# Patient Record
Sex: Female | Born: 1975 | Race: White | Hispanic: No | Marital: Married | Smoking: Never smoker
Health system: Southern US, Community
[De-identification: ages and names within clinical notes are randomized; demographics above are authoritative.]

## PROBLEM LIST (undated history)

## (undated) DIAGNOSIS — J45909 Unspecified asthma, uncomplicated: Secondary | ICD-10-CM

## (undated) DIAGNOSIS — Z9889 Other specified postprocedural states: Secondary | ICD-10-CM

## (undated) DIAGNOSIS — I10 Essential (primary) hypertension: Secondary | ICD-10-CM

## (undated) DIAGNOSIS — Z1509 Genetic susceptibility to other malignant neoplasm: Secondary | ICD-10-CM

## (undated) DIAGNOSIS — R112 Nausea with vomiting, unspecified: Secondary | ICD-10-CM

## (undated) DIAGNOSIS — Z800 Family history of malignant neoplasm of digestive organs: Secondary | ICD-10-CM

## (undated) DIAGNOSIS — J302 Other seasonal allergic rhinitis: Secondary | ICD-10-CM

## (undated) DIAGNOSIS — G43909 Migraine, unspecified, not intractable, without status migrainosus: Secondary | ICD-10-CM

## (undated) HISTORY — PX: CHOLECYSTECTOMY: SHX55

## (undated) HISTORY — PX: APPENDECTOMY: SHX54

## (undated) HISTORY — PX: NASAL SINUS SURGERY: SHX719

## (undated) HISTORY — PX: TONSILLECTOMY: SUR1361

## (undated) HISTORY — PX: ABDOMINAL HYSTERECTOMY: SHX81

## (undated) HISTORY — DX: Family history of malignant neoplasm of digestive organs: Z80.0

---

## 2011-04-18 ENCOUNTER — Emergency Department (HOSPITAL_BASED_OUTPATIENT_CLINIC_OR_DEPARTMENT_OTHER)
Admission: EM | Admit: 2011-04-18 | Discharge: 2011-04-19 | Disposition: A | Discharge status: Other Acute Inpatient Hospital | Payer: BC Managed Care – PPO | Attending: Emergency Medicine | Admitting: Emergency Medicine

## 2011-04-18 DIAGNOSIS — R45851 Suicidal ideations: Secondary | ICD-10-CM | POA: Insufficient documentation

## 2011-04-18 DIAGNOSIS — J45909 Unspecified asthma, uncomplicated: Secondary | ICD-10-CM | POA: Insufficient documentation

## 2011-04-18 DIAGNOSIS — N390 Urinary tract infection, site not specified: Secondary | ICD-10-CM | POA: Insufficient documentation

## 2011-04-18 LAB — URINALYSIS, ROUTINE W REFLEX MICROSCOPIC
Bilirubin Urine: NEGATIVE
Ketones, ur: 15 mg/dL — AB
Nitrite: NEGATIVE
Protein, ur: 30 mg/dL — AB
Urobilinogen, UA: 0.2 mg/dL (ref 0.0–1.0)
pH: 6.5 (ref 5.0–8.0)

## 2011-04-18 LAB — DIFFERENTIAL
Basophils Absolute: 0 10*3/uL (ref 0.0–0.1)
Basophils Relative: 0 % (ref 0–1)
Eosinophils Absolute: 0 10*3/uL (ref 0.0–0.7)
Monocytes Absolute: 0.5 10*3/uL (ref 0.1–1.0)
Monocytes Relative: 6 % (ref 3–12)
Neutro Abs: 7.5 10*3/uL (ref 1.7–7.7)
Neutrophils Relative %: 77 % (ref 43–77)

## 2011-04-18 LAB — RAPID URINE DRUG SCREEN, HOSP PERFORMED
Barbiturates: NOT DETECTED
Benzodiazepines: NOT DETECTED
Cocaine: NOT DETECTED
Opiates: NOT DETECTED
Tetrahydrocannabinol: NOT DETECTED

## 2011-04-18 LAB — CBC
Hemoglobin: 13.8 g/dL (ref 12.0–15.0)
MCH: 31.3 pg (ref 26.0–34.0)
MCHC: 35.3 g/dL (ref 30.0–36.0)
Platelets: 315 10*3/uL (ref 150–400)

## 2011-04-18 LAB — BASIC METABOLIC PANEL
CO2: 20 mEq/L (ref 19–32)
Calcium: 9.5 mg/dL (ref 8.4–10.5)
Chloride: 103 mEq/L (ref 96–112)
Glucose, Bld: 110 mg/dL — ABNORMAL HIGH (ref 70–99)
Potassium: 3.3 mEq/L — ABNORMAL LOW (ref 3.5–5.1)
Sodium: 139 mEq/L (ref 135–145)

## 2011-04-18 LAB — PREGNANCY, URINE: Preg Test, Ur: NEGATIVE

## 2011-04-18 LAB — ETHANOL: Alcohol, Ethyl (B): <11 mg/dL (ref 0–11)

## 2011-04-19 ENCOUNTER — Inpatient Hospital Stay (HOSPITAL_COMMUNITY): Admission: AD | Admit: 2011-04-19 | Payer: BC Managed Care – PPO | Source: Ambulatory Visit | Admitting: Psychiatry

## 2012-09-03 ENCOUNTER — Emergency Department (HOSPITAL_BASED_OUTPATIENT_CLINIC_OR_DEPARTMENT_OTHER)
Admission: EM | Admit: 2012-09-03 | Discharge: 2012-09-03 | Disposition: A | Discharge status: Home / Self Care | Payer: 59 | Attending: Emergency Medicine | Admitting: Emergency Medicine

## 2012-09-03 ENCOUNTER — Encounter (HOSPITAL_BASED_OUTPATIENT_CLINIC_OR_DEPARTMENT_OTHER): Payer: Self-pay | Admitting: *Deleted

## 2012-09-03 DIAGNOSIS — J45909 Unspecified asthma, uncomplicated: Secondary | ICD-10-CM | POA: Insufficient documentation

## 2012-09-03 DIAGNOSIS — J309 Allergic rhinitis, unspecified: Secondary | ICD-10-CM | POA: Insufficient documentation

## 2012-09-03 DIAGNOSIS — Z79899 Other long term (current) drug therapy: Secondary | ICD-10-CM | POA: Insufficient documentation

## 2012-09-03 DIAGNOSIS — G43909 Migraine, unspecified, not intractable, without status migrainosus: Secondary | ICD-10-CM | POA: Insufficient documentation

## 2012-09-03 DIAGNOSIS — R110 Nausea: Secondary | ICD-10-CM | POA: Insufficient documentation

## 2012-09-03 HISTORY — DX: Unspecified asthma, uncomplicated: J45.909

## 2012-09-03 HISTORY — DX: Other seasonal allergic rhinitis: J30.2

## 2012-09-03 HISTORY — DX: Migraine, unspecified, not intractable, without status migrainosus: G43.909

## 2012-09-03 MED ORDER — METOCLOPRAMIDE HCL 5 MG/ML IJ SOLN
10.0000 mg | Freq: Once | INTRAMUSCULAR | Status: AC
  Administered 2012-09-03: 10 mg via INTRAVENOUS
  Filled 2012-09-03: qty 2

## 2012-09-03 MED ORDER — DEXAMETHASONE SODIUM PHOSPHATE 10 MG/ML IJ SOLN
10.0000 mg | Freq: Once | INTRAMUSCULAR | Status: AC
  Administered 2012-09-03: 10 mg via INTRAVENOUS
  Filled 2012-09-03: qty 1

## 2012-09-03 MED ORDER — SODIUM CHLORIDE 0.9 % IV BOLUS (SEPSIS)
1000.0000 mL | Freq: Once | INTRAVENOUS | Status: AC
  Administered 2012-09-03: 1000 mL via INTRAVENOUS

## 2012-09-03 MED ORDER — DIPHENHYDRAMINE HCL 50 MG/ML IJ SOLN
25.0000 mg | Freq: Once | INTRAMUSCULAR | Status: AC
  Administered 2012-09-03: 25 mg via INTRAVENOUS
  Filled 2012-09-03: qty 1

## 2012-09-03 NOTE — ED Notes (Signed)
Migraine H/A onset 5p. No relief with Imitrex and OTC meds.

## 2012-09-03 NOTE — ED Provider Notes (Signed)
History   This chart was scribed for Rolan Bucco, MD by Thad Ranger, ED Scribe. This patient was seen in room MH05/MH05 and the patient's care was started at 9:40 PM.   CSN: [id number]  Arrival date & time 09/03/12  2020   None     Chief Complaint  Patient presents with  . Migraine    The history is provided by the patient and the spouse. No language interpreter was used.    Jamie Ellis is a 36 y.o. female with a history of migraines, who presents to the Emergency Department complaining of gradually worsening, gradual onset, constant migraine onset 4 hours ago. Patient reports that iheadache started in both sides and in the back of her head now it subsided to the front side of the face. There is associated nausea and eye pain. Patient states that she is having the same symptoms as her pervious episodes but slightly more intense. Patient is otherwise healthy. She denies fever, chills, vomiting, and abdominal pain. Patient repots taking Imitrex and OTC medications with no relief. She denies smoking and alcohol use.  Denies any fevers/neck stiffness, or recent head trauma.   Past Medical History  Diagnosis Date  . Migraines   . Asthma   . Seasonal allergies     Past Surgical History  Procedure Date  . Cholecystectomy   . Appendectomy   . Nasal sinus surgery     History reviewed. No pertinent family history.  History  Substance Use Topics  . Smoking status: Never Smoker   . Smokeless tobacco: Not on file  . Alcohol Use: No    No OB history provided.  Review of Systems  Constitutional: Negative for fever, chills, diaphoresis and fatigue.  HENT: Negative for congestion, rhinorrhea and sneezing.   Eyes: Negative.   Respiratory: Negative for cough, chest tightness and shortness of breath.   Cardiovascular: Negative for chest pain and leg swelling.  Gastrointestinal: Positive for nausea. Negative for vomiting, abdominal pain, diarrhea and blood in stool.    Genitourinary: Negative for frequency, hematuria, flank pain and difficulty urinating.  Musculoskeletal: Negative for back pain and arthralgias.  Skin: Negative for rash.  Neurological: Positive for headaches. Negative for dizziness, speech difficulty, weakness and numbness.    Allergies  Penicillins  Home Medications   Current Outpatient Rx  Name  Route  Sig  Dispense  Refill  . LORATADINE 10 MG PO TABS   Oral   Take 10 mg by mouth daily.         . MULTIVITAMINS PO CAPS   Oral   Take 1 capsule by mouth daily.         . SUMATRIPTAN SUCCINATE 100 MG PO TABS   Oral   Take 100 mg by mouth every 2 (two) hours as needed.           BP 151/109  Pulse 100  Temp 98.7 F (37.1 C) (Oral)  Resp 16  Ht 5\' 3"  (1.6 m)  Wt 187 lb (84.823 kg)  BMI 33.13 kg/m2  SpO2 98%  LMP 09/03/2012  Physical Exam  Constitutional: She is oriented to person, place, and time. She appears well-developed and well-nourished.  HENT:  Head: Normocephalic and atraumatic.  Eyes: Pupils are equal, round, and reactive to light.  Neck: Normal range of motion. Neck supple. No Brudzinski's sign noted.  Cardiovascular: Normal rate, regular rhythm and normal heart sounds.   Pulmonary/Chest: Effort normal and breath sounds normal. No respiratory distress. She has no wheezes.  She has no rales. She exhibits no tenderness.  Abdominal: Soft. Bowel sounds are normal. There is no tenderness. There is no rebound and no guarding.  Musculoskeletal: Normal range of motion. She exhibits no edema.  Lymphadenopathy:    She has no cervical adenopathy.  Neurological: She is alert and oriented to person, place, and time. She has normal strength. No cranial nerve deficit or sensory deficit. GCS eye subscore is 4. GCS verbal subscore is 5. GCS motor subscore is 6.       FTN intact   Skin: Skin is warm and dry. No rash noted.  Psychiatric: She has a normal mood and affect.    ED Course  Procedures (including critical  care time)  DIAGNOSTIC STUDIES: Oxygen Saturation is 98% on room air, normal by my interpretation.    COORDINATION OF CARE: 9:40 PM Discussed treatment plan with pt at bedside and pt agreed to plan.   Labs Reviewed - No data to display No results found.   1. Migraine       MDM  Pt feels much better after migraine cocktail.  Says that the pain is similar to her past migraines.  Nothing unusual to suggest meningitis, SAH.  Advised to return if symptoms worsen or if she has pain that is atypical as compared to her normal migraine pain      I personally performed the services described in this documentation, which was scribed in my presence.  The recorded information has been reviewed and considered.    Rolan Bucco, MD 09/03/12 2252

## 2014-12-14 ENCOUNTER — Encounter (HOSPITAL_BASED_OUTPATIENT_CLINIC_OR_DEPARTMENT_OTHER): Payer: Self-pay | Admitting: *Deleted

## 2014-12-14 ENCOUNTER — Emergency Department (HOSPITAL_BASED_OUTPATIENT_CLINIC_OR_DEPARTMENT_OTHER)
Admission: EM | Admit: 2014-12-14 | Discharge: 2014-12-14 | Disposition: A | Discharge status: Home / Self Care | Payer: 59 | Attending: Emergency Medicine | Admitting: Emergency Medicine

## 2014-12-14 DIAGNOSIS — J45909 Unspecified asthma, uncomplicated: Secondary | ICD-10-CM | POA: Diagnosis not present

## 2014-12-14 DIAGNOSIS — Z880 Allergy status to penicillin: Secondary | ICD-10-CM | POA: Insufficient documentation

## 2014-12-14 DIAGNOSIS — Z79899 Other long term (current) drug therapy: Secondary | ICD-10-CM | POA: Diagnosis not present

## 2014-12-14 DIAGNOSIS — I10 Essential (primary) hypertension: Secondary | ICD-10-CM | POA: Diagnosis not present

## 2014-12-14 DIAGNOSIS — S0181XA Laceration without foreign body of other part of head, initial encounter: Secondary | ICD-10-CM | POA: Diagnosis present

## 2014-12-14 DIAGNOSIS — Y998 Other external cause status: Secondary | ICD-10-CM | POA: Diagnosis not present

## 2014-12-14 DIAGNOSIS — Y9289 Other specified places as the place of occurrence of the external cause: Secondary | ICD-10-CM | POA: Diagnosis not present

## 2014-12-14 DIAGNOSIS — G43909 Migraine, unspecified, not intractable, without status migrainosus: Secondary | ICD-10-CM | POA: Insufficient documentation

## 2014-12-14 DIAGNOSIS — Y9389 Activity, other specified: Secondary | ICD-10-CM | POA: Diagnosis not present

## 2014-12-14 DIAGNOSIS — W01198A Fall on same level from slipping, tripping and stumbling with subsequent striking against other object, initial encounter: Secondary | ICD-10-CM | POA: Diagnosis not present

## 2014-12-14 HISTORY — DX: Essential (primary) hypertension: I10

## 2014-12-14 MED ORDER — LIDOCAINE-EPINEPHRINE (PF) 2 %-1:200000 IJ SOLN
10.0000 mL | Freq: Once | INTRAMUSCULAR | Status: AC
  Administered 2014-12-14: 10 mL via INTRADERMAL
  Filled 2014-12-14: qty 20

## 2014-12-14 NOTE — ED Provider Notes (Signed)
CSN: [id number]     Arrival date & time 12/14/14  4098 History   First MD Initiated Contact with Patient 12/14/14 2203     Chief Complaint  Patient presents with  . Facial Laceration     (Consider location/radiation/quality/duration/timing/severity/associated sxs/prior Treatment) HPI   Jamie Ellis is a 39 y.o. female complaining of laceration above her right eye after she slipped over a puppy prior to arrival. Patient's face impacted at [employer] [employer], the teakettle did not break. Her pain is minimal, tetanus shot was within the last 5 years. Bleeding is controlled.  Past Medical History  Diagnosis Date  . Migraines   . Asthma   . Seasonal allergies   . Hypertension    Past Surgical History  Procedure Laterality Date  . Cholecystectomy    . Appendectomy    . Nasal sinus surgery     No family history on file. History  Substance Use Topics  . Smoking status: Never Smoker   . Smokeless tobacco: Never Used  . Alcohol Use: No   OB History    No data available     Review of Systems  10 systems reviewed and found to be negative, except as noted in the HPI.   Allergies  Penicillins  Home Medications   Prior to Admission medications   Medication Sig Start Date End Date Taking? Authorizing Provider  citalopram (CELEXA) 20 MG tablet Take 20 mg by mouth daily.   Yes Historical Provider, MD  loratadine (CLARITIN) 10 MG tablet Take 10 mg by mouth daily.   Yes Historical Provider, MD  metoprolol succinate (TOPROL-XL) 100 MG 24 hr tablet Take 100 mg by mouth daily. Take with or immediately following a meal.   Yes Historical Provider, MD  Multiple Vitamin (MULTIVITAMIN) capsule Take 1 capsule by mouth daily.   Yes Historical Provider, MD  SUMAtriptan (IMITREX) 100 MG tablet Take 100 mg by mouth every 2 (two) hours as needed.   Yes Historical Provider, MD   BP 141/97 mmHg  Pulse 110  Temp(Src) 98.5 F (36.9 C) (Oral)  Resp 20  Ht  (1.6 m)  Wt 192 lb (87.091 kg)  BMI  34.02 kg/m2  SpO2 96% Physical Exam  Constitutional: She is oriented to person, place, and time. She appears well-developed and well-nourished. No distress.  HENT:  Head: Normocephalic.    Mouth/Throat: Oropharynx is clear and moist.  2 cm full-thickness laceration, bleeding is controlled. No bone is visible. There is no tenderness palpation along the orbital rim.  Eyes: Conjunctivae and EOM are normal.  Cardiovascular: Normal rate, regular rhythm and intact distal pulses.   Pulmonary/Chest: Effort normal. No stridor.  Musculoskeletal: Normal range of motion.  Neurological: She is alert and oriented to person, place, and time.  Psychiatric: She has a normal mood and affect.  Nursing note and vitals reviewed.   ED Course  LACERATION REPAIR Date/Time: 12/15/2014 12:03 AM Performed by: Wynetta Emery Authorized by: Wynetta Emery Consent: Verbal consent obtained. Risks and benefits: risks, benefits and alternatives were discussed Consent given by: patient Patient identity confirmed: verbally with patient Body area: head/neck Location details: forehead Laceration length: 2 cm Foreign bodies: no foreign bodies Tendon involvement: none Nerve involvement: none Local anesthetic: bupivacaine 0.5% with epinephrine Anesthetic total: 3 ml Patient sedated: no Preparation: Patient was prepped and draped in the usual sterile fashion. Irrigation solution: saline Irrigation method: syringe Amount of cleaning: standard Debridement: none Degree of undermining: none Skin closure: Ethilon (6-0) Wound subcutaneous closure material used:  4-0 Vicryl Rapide. Number of sutures: 5 Technique: running Approximation: close Approximation difficulty: simple Dressing: antibiotic ointment Patient tolerance: Patient tolerated the procedure well with no immediate complications   (including critical care time) Labs Review Labs Reviewed - No data to display  Imaging Review No results  found.   EKG Interpretation None      MDM   Final diagnoses:  Facial laceration, initial encounter    Filed Vitals:   12/14/14 1930 12/14/14 2330  BP: 141/97 101/64  Pulse: 110 79  Temp: 98.5 F (36.9 C)   TempSrc: Oral   Resp: 20   Height: 5\' 3"  (1.6 m)   Weight: 192 lb (87.091 kg)   SpO2: 96% 92%    Medications  lidocaine-EPINEPHrine (XYLOCAINE W/EPI) 2 %-1:200000 (PF) injection 10 mL (10 mLs Intradermal Given by Other 12/14/14 2332)    Jamie Ellis is a pleasant 39 y.o. female presenting with laceration over left eyebrow. Wound is irrigated, one absorbable suture is placed in the deep tissue, wound is closed with good approximation of borders, instructed patient on wound care.  Evaluation does not show pathology that would require ongoing emergent intervention or inpatient treatment. Pt is hemodynamically stable and mentating appropriately. Discussed findings and plan with patient/guardian, who agrees with care plan. All questions answered. Return precautions discussed and outpatient follow up given.      Wynetta Emeryicole Mihailo Sage, PA-C 12/15/14 0006  Richardean Canalavid H Yao, MD 12/17/14 [phone]

## 2014-12-14 NOTE — Discharge Instructions (Signed)
Keep wound dry and do not remove dressing for 24 hours if possible. After that, wash gently morning and night (every 12 hours) with soap and water. Use a topical antibiotic ointment and cover with a bandaid or gauze.  °  °Do NOT use rubbing alcohol or hydrogen peroxide, do not soak the area °  °Present to your primary care doctor or the urgent care of your choice, or the ED for suture removal in 7-10 days. °  °Every attempt was made to remove foreign body (contaminants) from the wound.  However, there is always a chance that some may remain in the wound. This can  increase your risk of infection. °  °If you see signs of infection (warmth, redness, tenderness, pus, sharp increase in pain, fever, red streaking in the skin) immediately return to the emergency department. °  °After the wound heals fully, apply sunscreen for 6-12 months to minimize scarring.  ° °

## 2014-12-14 NOTE — ED Notes (Signed)
Report tripped over her dog and landed on tea kettle- has lac next to right eyebrow- bleeding controlled

## 2014-12-15 ENCOUNTER — Emergency Department (HOSPITAL_BASED_OUTPATIENT_CLINIC_OR_DEPARTMENT_OTHER)
Admission: EM | Admit: 2014-12-15 | Discharge: 2014-12-16 | Disposition: A | Discharge status: Home / Self Care | Payer: 59 | Attending: Emergency Medicine | Admitting: Emergency Medicine

## 2014-12-15 ENCOUNTER — Emergency Department (HOSPITAL_BASED_OUTPATIENT_CLINIC_OR_DEPARTMENT_OTHER): Payer: 59

## 2014-12-15 ENCOUNTER — Encounter (HOSPITAL_BASED_OUTPATIENT_CLINIC_OR_DEPARTMENT_OTHER): Payer: Self-pay | Admitting: Emergency Medicine

## 2014-12-15 DIAGNOSIS — S0990XD Unspecified injury of head, subsequent encounter: Secondary | ICD-10-CM | POA: Diagnosis present

## 2014-12-15 DIAGNOSIS — Z79899 Other long term (current) drug therapy: Secondary | ICD-10-CM | POA: Diagnosis not present

## 2014-12-15 DIAGNOSIS — G43909 Migraine, unspecified, not intractable, without status migrainosus: Secondary | ICD-10-CM | POA: Diagnosis not present

## 2014-12-15 DIAGNOSIS — W1839XD Other fall on same level, subsequent encounter: Secondary | ICD-10-CM | POA: Insufficient documentation

## 2014-12-15 DIAGNOSIS — R110 Nausea: Secondary | ICD-10-CM

## 2014-12-15 DIAGNOSIS — S060X0D Concussion without loss of consciousness, subsequent encounter: Secondary | ICD-10-CM | POA: Diagnosis not present

## 2014-12-15 DIAGNOSIS — I10 Essential (primary) hypertension: Secondary | ICD-10-CM | POA: Diagnosis not present

## 2014-12-15 DIAGNOSIS — R112 Nausea with vomiting, unspecified: Secondary | ICD-10-CM | POA: Insufficient documentation

## 2014-12-15 DIAGNOSIS — J45909 Unspecified asthma, uncomplicated: Secondary | ICD-10-CM | POA: Insufficient documentation

## 2014-12-15 DIAGNOSIS — J029 Acute pharyngitis, unspecified: Secondary | ICD-10-CM | POA: Diagnosis not present

## 2014-12-15 DIAGNOSIS — Z880 Allergy status to penicillin: Secondary | ICD-10-CM | POA: Insufficient documentation

## 2014-12-15 DIAGNOSIS — R5383 Other fatigue: Secondary | ICD-10-CM | POA: Insufficient documentation

## 2014-12-15 LAB — CBC WITH DIFFERENTIAL/PLATELET
BASOS ABS: 0 10*3/uL (ref 0.0–0.1)
BASOS PCT: 0 % (ref 0–1)
EOS ABS: 0.1 10*3/uL (ref 0.0–0.7)
EOS PCT: 1 % (ref 0–5)
HCT: 38.1 % (ref 36.0–46.0)
Hemoglobin: 13 g/dL (ref 12.0–15.0)
LYMPHS ABS: 2.5 10*3/uL (ref 0.7–4.0)
Lymphocytes Relative: 25 % (ref 12–46)
MCH: 31.6 pg (ref 26.0–34.0)
MCHC: 34.1 g/dL (ref 30.0–36.0)
MCV: 92.7 fL (ref 78.0–100.0)
Monocytes Absolute: 0.4 10*3/uL (ref 0.1–1.0)
Monocytes Relative: 4 % (ref 3–12)
Neutro Abs: 6.9 10*3/uL (ref 1.7–7.7)
Neutrophils Relative %: 70 % (ref 43–77)
Platelets: 312 10*3/uL (ref 150–400)
RBC: 4.11 MIL/uL (ref 3.87–5.11)
RDW: 13.4 % (ref 11.5–15.5)
WBC: 9.9 10*3/uL (ref 4.0–10.5)

## 2014-12-15 LAB — COMPREHENSIVE METABOLIC PANEL
ALT: 28 U/L (ref 0–35)
AST: 27 U/L (ref 0–37)
Albumin: 4.4 g/dL (ref 3.5–5.2)
Alkaline Phosphatase: 56 U/L (ref 39–117)
Anion gap: 4 — ABNORMAL LOW (ref 5–15)
BUN: 14 mg/dL (ref 6–23)
CALCIUM: 8.9 mg/dL (ref 8.4–10.5)
CO2: 28 mmol/L (ref 19–32)
CREATININE: 0.75 mg/dL (ref 0.50–1.10)
Chloride: 104 mmol/L (ref 96–112)
GFR calc Af Amer: >90 mL/min (ref >90)
GFR calc non Af Amer: >90 mL/min (ref >90)
Glucose, Bld: 110 mg/dL — ABNORMAL HIGH (ref 70–99)
POTASSIUM: 3.9 mmol/L (ref 3.5–5.1)
Sodium: 136 mmol/L (ref 135–145)
TOTAL PROTEIN: 8 g/dL (ref 6.0–8.3)
Total Bilirubin: 0.5 mg/dL (ref 0.3–1.2)

## 2014-12-15 LAB — I-STAT CG4 LACTIC ACID, ED: Lactic Acid, Venous: 1.21 mmol/L (ref 0.5–2.0)

## 2014-12-15 MED ORDER — ONDANSETRON HCL 4 MG/2ML IJ SOLN
INTRAMUSCULAR | Status: AC
  Filled 2014-12-15: qty 2

## 2014-12-15 MED ORDER — SODIUM CHLORIDE 0.9 % IV SOLN
Freq: Once | INTRAVENOUS | Status: AC
  Administered 2014-12-15: 23:00:00 via INTRAVENOUS

## 2014-12-15 MED ORDER — ONDANSETRON HCL 4 MG/2ML IJ SOLN
4.0000 mg | Freq: Once | INTRAMUSCULAR | Status: AC
  Administered 2014-12-15: 4 mg via INTRAVENOUS

## 2014-12-15 NOTE — ED Notes (Signed)
Pt seen last PM for head injury related to fall and laceration repair currently c/o dizziness nausea and vomiting

## 2014-12-15 NOTE — ED Provider Notes (Signed)
CSN: [id number]     Arrival date & time 12/15/14  2115 History  This chart was scribed for [facility] Hospital[facility]ole Kebin Maye, PA-C with Juliet RudeNathan R. Rubin PayorPickering, MD by Tonye RoyaltyJoshua Chen, ED Scribe. This patient was seen in room MH04/MH04 and the patient's care was started at 10:46 PM.    Chief Complaint  Patient presents with  . Head Injury    dizziness post injury   The history is provided by the patient. No language interpreter was used.    HPI Comments: Jamie MannKelly Ellis is a 39 y.o. female who presents to the Emergency Department complaining of head injury yesterday. She states she has had nausea, vomiting, and fatigue today. She reports associated subjective fever, chills, decreased fluid intake, and sore throat. She states she has a feeling behind her left eye like the beginning of a migraine (which typically affects that area for her) but denies headache otherwise. She states that trying to text tonight made her nausiated and she had difficulty focusing her eyes. She denies sick contacts. She states she used medication for pain last night and this morning. She denies abdominal pain, diarrhea, cough, dysuria, frequency, or myalgias.   Past Medical History  Diagnosis Date  . Migraines   . Asthma   . Seasonal allergies   . Hypertension    Past Surgical History  Procedure Laterality Date  . Cholecystectomy    . Appendectomy    . Nasal sinus surgery     History reviewed. No pertinent family history. History  Substance Use Topics  . Smoking status: Never Smoker   . Smokeless tobacco: Never Used  . Alcohol Use: No   OB History    No data available     Review of Systems  Constitutional: Positive for fever, chills and fatigue.  HENT: Positive for sore throat.   Eyes: Positive for visual disturbance.  Respiratory: Negative for cough.   Gastrointestinal: Positive for nausea and vomiting. Negative for abdominal pain and diarrhea.  Genitourinary: Negative for dysuria and frequency.  Musculoskeletal: Negative  for myalgias.  Neurological: Positive for headaches.      Allergies  Penicillins  Home Medications   Prior to Admission medications   Medication Sig Start Date End Date Taking? Authorizing Provider  citalopram (CELEXA) 20 MG tablet Take 20 mg by mouth daily.    Historical Provider, MD  loratadine (CLARITIN) 10 MG tablet Take 10 mg by mouth daily.    Historical Provider, MD  metoprolol succinate (TOPROL-XL) 100 MG 24 hr tablet Take 100 mg by mouth daily. Take with or immediately following a meal.    Historical Provider, MD  Multiple Vitamin (MULTIVITAMIN) capsule Take 1 capsule by mouth daily.    Historical Provider, MD  SUMAtriptan (IMITREX) 100 MG tablet Take 100 mg by mouth every 2 (two) hours as needed.    Historical Provider, MD   BP 142/87 mmHg  Pulse 96  Temp(Src) 99 F (37.2 C) (Oral)  Resp 18  SpO2 99% Physical Exam  Constitutional: She is oriented to person, place, and time. She appears well-developed and well-nourished. No distress.  Mild pallor  HENT:  Head: Normocephalic.  Mouth/Throat: Oropharynx is clear and moist.  Well healing laceration to right forehead CDI  Eyes: Conjunctivae and EOM are normal. Pupils are equal, round, and reactive to light.  Neck: Normal range of motion.  FROM to C-spine. Pt can touch chin to chest without discomfort. No TTP of midline cervical spine.   Cardiovascular: Normal rate, regular rhythm and intact distal pulses.  Pulmonary/Chest: Effort normal and breath sounds normal. No stridor.  Abdominal: Soft. Bowel sounds are normal.  Musculoskeletal: Normal range of motion. She exhibits no edema or tenderness.  Neurological: She is alert and oriented to person, place, and time. She has normal reflexes. No cranial nerve deficit.  Psychiatric: She has a normal mood and affect.  Nursing note and vitals reviewed.   ED Course  Procedures (including critical care time)  DIAGNOSTIC STUDIES: Oxygen Saturation is 99% on room air, normal  by my interpretation.    COORDINATION OF CARE: 10:52 PM Discussed treatment plan with patient at beside, the patient agrees with the plan and has no further questions at this time.   Labs Review Labs Reviewed - No data to display  Imaging Review No results found.   EKG Interpretation None      MDM   Final diagnoses:  None    Filed Vitals:   12/15/14 2129 12/16/14 0054  BP: 142/87 133/80  Pulse: 96 75  Temp: 99 F (37.2 C) 98.2 F (36.8 C)  TempSrc: Oral Oral  Resp: 18 18  SpO2: 99% 96%    Medications  ondansetron (ZOFRAN) 4 MG/2ML injection (  Duplicate 12/15/14 2248)  ondansetron (ZOFRAN) injection 4 mg (4 mg Intravenous Given 12/15/14 2249)  0.9 %  sodium chloride infusion ( Intravenous Stopped 12/15/14 2346)    Jamie Ellis is a pleasant 39 y.o. female presenting with N/V, HA, fatigue s/p minor head trauma yesterday. Neuro exam non focal and head CT negative. Bloodwork unremarkable, Pt cannot produce urine sample, but no symptoms of UTI. Likely concussion, extensive discussions of avoiding second impact.   Evaluation does not show pathology that would require ongoing emergent intervention or inpatient treatment. Pt is hemodynamically stable and mentating appropriately. Discussed findings and plan with patient/guardian, who agrees with care plan. All questions answered. Return precautions discussed and outpatient follow up given.   Discharge Medication List as of 12/16/2014 12:50 AM    START taking these medications   Details  ondansetron (ZOFRAN) 4 MG tablet Take 1 tablet (4 mg total) by mouth every 8 (eight) hours as needed for nausea or vomiting., Starting 12/16/2014, Until Discontinued, Print         I personally performed the services described in this documentation, which was scribed in my presence. The recorded information has been reviewed and is accurate.   Wynetta Emery, PA-C 12/17/14 1652  Juliet Rude. Rubin Payor, MD 12/17/14 2132

## 2014-12-16 MED ORDER — ONDANSETRON HCL 4 MG PO TABS: 4.0000 mg | ORAL_TABLET | Freq: Three times a day (TID) | ORAL | Status: AC | PRN

## 2014-12-16 NOTE — Discharge Instructions (Signed)
Do not participate in any sports or any activities that could result in head trauma until you are cleared by your pediatrician,  primary care physician or neurologist.   Please follow with your primary care doctor in the next 2 days for a check-up. They must obtain records for further management.   Do not hesitate to return to the Emergency Department for any new, worsening or concerning symptoms.    Concussion A concussion, or closed-head injury, is a brain injury caused by a direct blow to the head or by a quick and sudden movement (jolt) of the head or neck. Concussions are usually not life-threatening. Even so, the effects of a concussion can be serious. If you have had a concussion before, you are more likely to experience concussion-like symptoms after a direct blow to the head.  CAUSES  Direct blow to the head, such as from running into another player during a soccer game, being hit in a fight, or hitting your head on a hard surface.  A jolt of the head or neck that causes the brain to move back and forth inside the skull, such as in a car crash. SIGNS AND SYMPTOMS The signs of a concussion can be hard to notice. Early on, they may be missed by you, family members, and health care providers. You may look fine but act or feel differently. Symptoms are usually temporary, but they may last for days, weeks, or even longer. Some symptoms may appear right away while others may not show up for hours or days. Every head injury is different. Symptoms include:  Mild to moderate headaches that will not go away.  A feeling of pressure inside your head.  Having more trouble than usual:  Learning or remembering things you have heard.  Answering questions.  Paying attention or concentrating.  Organizing daily tasks.  Making decisions and solving problems.  Slowness in thinking, acting or reacting, speaking, or reading.  Getting lost or being easily confused.  Feeling tired all the time  or lacking energy (fatigued).  Feeling drowsy.  Sleep disturbances.  Sleeping more than usual.  Sleeping less than usual.  Trouble falling asleep.  Trouble sleeping (insomnia).  Loss of balance or feeling lightheaded or dizzy.  Nausea or vomiting.  Numbness or tingling.  Increased sensitivity to:  Sounds.  Lights.  Distractions.  Vision problems or eyes that tire easily.  Diminished sense of taste or smell.  Ringing in the ears.  Mood changes such as feeling sad or anxious.  Becoming easily irritated or angry for little or no reason.  Lack of motivation.  Seeing or hearing things other people do not see or hear (hallucinations). DIAGNOSIS Your health care provider can usually diagnose a concussion based on a description of your injury and symptoms. He or she will ask whether you passed out (lost consciousness) and whether you are having trouble remembering events that happened right before and during your injury. Your evaluation might include:  A brain scan to look for signs of injury to the brain. Even if the test shows no injury, you may still have a concussion.  Blood tests to be sure other problems are not present. TREATMENT  Concussions are usually treated in an emergency department, in urgent care, or at a clinic. You may need to stay in the hospital overnight for further treatment.  Tell your health care provider if you are taking any medicines, including prescription medicines, over-the-counter medicines, and natural remedies. Some medicines, such as blood thinners (  anticoagulants) and aspirin, may increase the chance of complications. Also tell your health care provider whether you have had alcohol or are taking illegal drugs. This information may affect treatment.  Your health care provider will send you home with important instructions to follow.  How fast you will recover from a concussion depends on many factors. These factors include how severe  your concussion is, what part of your brain was injured, your age, and how healthy you were before the concussion.  Most people with mild injuries recover fully. Recovery can take time. In general, recovery is slower in older persons. Also, persons who have had a concussion in the past or have other medical problems may find that it takes longer to recover from their current injury. HOME CARE INSTRUCTIONS General Instructions  Carefully follow the directions your health care provider gave you.  Only take over-the-counter or prescription medicines for pain, discomfort, or fever as directed by your health care provider.  Take only those medicines that your health care provider has approved.  Do not drink alcohol until your health care provider says you are well enough to do so. Alcohol and certain other drugs may slow your recovery and can put you at risk of further injury.  If it is harder than usual to remember things, write them down.  If you are easily distracted, try to do one thing at a time. For example, do not try to watch TV while fixing dinner.  Talk with family members or close friends when making important decisions.  Keep all follow-up appointments. Repeated evaluation of your symptoms is recommended for your recovery.  Watch your symptoms and tell others to do the same. Complications sometimes occur after a concussion. Older adults with a brain injury may have a higher risk of serious complications, such as a blood clot on the brain.  Tell your teachers, school nurse, school counselor, coach, athletic trainer, or work Freight forwarder about your injury, symptoms, and restrictions. Tell them about what you can or cannot do. They should watch for:  Increased problems with attention or concentration.  Increased difficulty remembering or learning new information.  Increased time needed to complete tasks or assignments.  Increased irritability or decreased ability to cope with  stress.  Increased symptoms.  Rest. Rest helps the brain to heal. Make sure you:  Get plenty of sleep at night. Avoid staying up late at night.  Keep the same bedtime hours on weekends and weekdays.  Rest during the day. Take daytime naps or rest breaks when you feel tired.  Limit activities that require a lot of thought or concentration. These include:  Doing homework or job-related work.  Watching TV.  Working on the computer.  Avoid any situation where there is potential for another head injury (football, hockey, soccer, basketball, martial arts, downhill snow sports and horseback riding). Your condition will get worse every time you experience a concussion. You should avoid these activities until you are evaluated by the appropriate follow-up health care providers. Returning To Your Regular Activities You will need to return to your normal activities slowly, not all at once. You must give your body and brain enough time for recovery.  Do not return to sports or other athletic activities until your health care provider tells you it is safe to do so.  Ask your health care provider when you can drive, ride a bicycle, or operate heavy machinery. Your ability to react may be slower after a brain injury. Never do these activities  if you are dizzy.  Ask your health care provider about when you can return to work or school. Preventing Another Concussion It is very important to avoid another brain injury, especially before you have recovered. In rare cases, another injury can lead to permanent brain damage, brain swelling, or death. The risk of this is greatest during the first 7-10 days after a head injury. Avoid injuries by:  Wearing a seat belt when riding in a car.  Drinking alcohol only in moderation.  Wearing a helmet when biking, skiing, skateboarding, skating, or doing similar activities.  Avoiding activities that could lead to a second concussion, such as contact or  recreational sports, until your health care provider says it is okay.  Taking safety measures in your home.  Remove clutter and tripping hazards from floors and stairways.  Use grab bars in bathrooms and handrails by stairs.  Place non-slip mats on floors and in bathtubs.  Improve lighting in dim areas. SEEK MEDICAL CARE IF:  You have increased problems paying attention or concentrating.  You have increased difficulty remembering or learning new information.  You need more time to complete tasks or assignments than before.  You have increased irritability or decreased ability to cope with stress.  You have more symptoms than before. Seek medical care if you have any of the following symptoms for more than 2 weeks after your injury:  Lasting (chronic) headaches.  Dizziness or balance problems.  Nausea.  Vision problems.  Increased sensitivity to noise or light.  Depression or mood swings.  Anxiety or irritability.  Memory problems.  Difficulty concentrating or paying attention.  Sleep problems.  Feeling tired all the time. SEEK IMMEDIATE MEDICAL CARE IF:  You have severe or worsening headaches. These may be a sign of a blood clot in the brain.  You have weakness (even if only in one hand, leg, or part of the face).  You have numbness.  You have decreased coordination.  You vomit repeatedly.  You have increased sleepiness.  One pupil is larger than the other.  You have convulsions.  You have slurred speech.  You have increased confusion. This may be a sign of a blood clot in the brain.  You have increased restlessness, agitation, or irritability.  You are unable to recognize people or places.  You have neck pain.  It is difficult to wake you up.  You have unusual behavior changes.  You lose consciousness. MAKE SURE YOU:  Understand these instructions.  Will watch your condition.  Will get help right away if you are not doing well or  get worse. Document Released: 12/25/2003 Document Revised: 10/09/2013 Document Reviewed: 04/26/2013 [facility] Patient Information 2015 [place], [place]. This information is not intended to replace advice given to you by your health care provider. Make sure you discuss any questions you have with your health care provider.

## 2014-12-16 NOTE — ED Notes (Signed)
Pt to bathroom for UA - pt states she cannot urinate at this time.

## 2014-12-16 NOTE — ED Notes (Signed)
Was tried to give urine sample, but unable. I notified nurse Kaila.

## 2015-05-08 ENCOUNTER — Emergency Department (HOSPITAL_BASED_OUTPATIENT_CLINIC_OR_DEPARTMENT_OTHER): Payer: 59

## 2015-05-08 ENCOUNTER — Emergency Department (HOSPITAL_BASED_OUTPATIENT_CLINIC_OR_DEPARTMENT_OTHER)
Admission: EM | Admit: 2015-05-08 | Discharge: 2015-05-09 | Disposition: A | Discharge status: Other Acute Inpatient Hospital | Payer: 59 | Attending: Emergency Medicine | Admitting: Emergency Medicine

## 2015-05-08 ENCOUNTER — Encounter (HOSPITAL_BASED_OUTPATIENT_CLINIC_OR_DEPARTMENT_OTHER): Payer: Self-pay

## 2015-05-08 DIAGNOSIS — I10 Essential (primary) hypertension: Secondary | ICD-10-CM | POA: Insufficient documentation

## 2015-05-08 DIAGNOSIS — G43909 Migraine, unspecified, not intractable, without status migrainosus: Secondary | ICD-10-CM | POA: Insufficient documentation

## 2015-05-08 DIAGNOSIS — Z79899 Other long term (current) drug therapy: Secondary | ICD-10-CM | POA: Diagnosis not present

## 2015-05-08 DIAGNOSIS — Z880 Allergy status to penicillin: Secondary | ICD-10-CM | POA: Insufficient documentation

## 2015-05-08 DIAGNOSIS — R509 Fever, unspecified: Secondary | ICD-10-CM | POA: Diagnosis present

## 2015-05-08 DIAGNOSIS — R079 Chest pain, unspecified: Secondary | ICD-10-CM

## 2015-05-08 DIAGNOSIS — J45909 Unspecified asthma, uncomplicated: Secondary | ICD-10-CM | POA: Insufficient documentation

## 2015-05-08 DIAGNOSIS — J982 Interstitial emphysema: Secondary | ICD-10-CM | POA: Diagnosis not present

## 2015-05-08 LAB — CBC WITH DIFFERENTIAL/PLATELET
BASOS ABS: 0 10*3/uL (ref 0.0–0.1)
Basophils Relative: 0 % (ref 0–1)
Eosinophils Absolute: 0.1 10*3/uL (ref 0.0–0.7)
Eosinophils Relative: 1 % (ref 0–5)
HCT: 38.3 % (ref 36.0–46.0)
Hemoglobin: 13.2 g/dL (ref 12.0–15.0)
LYMPHS PCT: 9 % — AB (ref 12–46)
Lymphs Abs: 1.5 10*3/uL (ref 0.7–4.0)
MCH: 32.3 pg (ref 26.0–34.0)
MCHC: 34.5 g/dL (ref 30.0–36.0)
MCV: 93.6 fL (ref 78.0–100.0)
Monocytes Absolute: 0.8 10*3/uL (ref 0.1–1.0)
Monocytes Relative: 5 % (ref 3–12)
Neutro Abs: 14 10*3/uL — ABNORMAL HIGH (ref 1.7–7.7)
Neutrophils Relative %: 85 % — ABNORMAL HIGH (ref 43–77)
PLATELETS: 316 10*3/uL (ref 150–400)
RBC: 4.09 MIL/uL (ref 3.87–5.11)
RDW: 13.4 % (ref 11.5–15.5)
WBC: 16.6 10*3/uL — ABNORMAL HIGH (ref 4.0–10.5)

## 2015-05-08 LAB — BASIC METABOLIC PANEL
Anion gap: 6 (ref 5–15)
BUN: 12 mg/dL (ref 6–20)
CALCIUM: 8.6 mg/dL — AB (ref 8.9–10.3)
CO2: 25 mmol/L (ref 22–32)
Chloride: 108 mmol/L (ref 101–111)
Creatinine, Ser: 0.82 mg/dL (ref 0.44–1.00)
Glucose, Bld: 129 mg/dL — ABNORMAL HIGH (ref 65–99)
Potassium: 3.6 mmol/L (ref 3.5–5.1)
Sodium: 139 mmol/L (ref 135–145)

## 2015-05-08 LAB — PREGNANCY, URINE: Preg Test, Ur: NEGATIVE

## 2015-05-08 LAB — I-STAT CG4 LACTIC ACID, ED: Lactic Acid, Venous: 1.92 mmol/L (ref 0.5–2.0)

## 2015-05-08 LAB — HEPATIC FUNCTION PANEL
ALK PHOS: 67 U/L (ref 38–126)
ALT: 47 U/L (ref 14–54)
AST: 41 U/L (ref 15–41)
Albumin: 3.8 g/dL (ref 3.5–5.0)
BILIRUBIN TOTAL: 0.4 mg/dL (ref 0.3–1.2)
Bilirubin, Direct: 0.1 mg/dL (ref 0.1–0.5)
Indirect Bilirubin: 0.3 mg/dL (ref 0.3–0.9)
Total Protein: 6.8 g/dL (ref 6.5–8.1)

## 2015-05-08 LAB — LIPASE, BLOOD: Lipase: 33 U/L (ref 22–51)

## 2015-05-08 MED ORDER — SODIUM CHLORIDE 0.9 % IV BOLUS (SEPSIS)
1000.0000 mL | Freq: Once | INTRAVENOUS | Status: AC
  Administered 2015-05-08: 1000 mL via INTRAVENOUS

## 2015-05-08 MED ORDER — ACETAMINOPHEN 650 MG RE SUPP
975.0000 mg | Freq: Once | RECTAL | Status: AC
  Administered 2015-05-08: 975 mg via RECTAL
  Filled 2015-05-08 (×2): qty 1

## 2015-05-08 MED ORDER — SODIUM CHLORIDE 0.9 % IV SOLN
500.0000 mg | Freq: Four times a day (QID) | INTRAVENOUS | Status: DC
  Administered 2015-05-08: 500 mg via INTRAVENOUS
  Filled 2015-05-08: qty 500

## 2015-05-08 MED ORDER — ONDANSETRON HCL 4 MG/2ML IJ SOLN
4.0000 mg | Freq: Once | INTRAMUSCULAR | Status: AC
  Administered 2015-05-08: 4 mg via INTRAVENOUS
  Filled 2015-05-08: qty 2

## 2015-05-08 MED ORDER — HYDROMORPHONE HCL 1 MG/ML IJ SOLN
0.5000 mg | Freq: Once | INTRAMUSCULAR | Status: AC
  Administered 2015-05-08: 0.5 mg via INTRAVENOUS
  Filled 2015-05-08: qty 1

## 2015-05-08 NOTE — ED Notes (Signed)
Patient presents to ED with a fever. Patient had an endoscopy today at approx. 1500. Patient reports a tight  And sore chest. Patient has not taken mediation for her fever because she is having reflux from the procedure today and didn't want to make that worse. The patient reports feeling very "achey". Patient reports recent nausea and denies feeling any sense of LOC.

## 2015-05-08 NOTE — ED Provider Notes (Signed)
CSN: [id number]     Arrival date & time 05/08/15  1949 History   First MD Initiated Contact with Patient 05/08/15 2016     Chief Complaint  Patient presents with  . Fever     (Consider location/radiation/quality/duration/timing/severity/associated sxs/prior Treatment) HPI Comments: 39 year old female with a history of acid reflux who presents with fever and chest pain. The patient had an upper endoscopy at approximately 3 PM today. During the procedure, she was diagnosed with an esophageal stricture which was dilated. She also had several biopsies. She went home and at approximately 5 PM she began having chills followed by generalized body aches and fever up to 100.7. She has developed a moderate, constant, stabbing chest pain as well as chest tightness that radiates across to her sides. She denies any abdominal pain. She has had nausea but no vomiting. No urinary symptoms.  Patient is a 39 y.o. female presenting with fever. The history is provided by the patient.  Fever   Past Medical History  Diagnosis Date  . Migraines   . Asthma   . Seasonal allergies   . Hypertension    Past Surgical History  Procedure Laterality Date  . Cholecystectomy    . Appendectomy    . Nasal sinus surgery     No family history on file. History  Substance Use Topics  . Smoking status: Never Smoker   . Smokeless tobacco: Never Used  . Alcohol Use: 1.2 oz/week    2 Glasses of wine per week   OB History    No data available     Review of Systems  Constitutional: Positive for fever.   10 Systems reviewed and are negative for acute change except as noted in the HPI.    Allergies  Penicillins  Home Medications   Prior to Admission medications   Medication Sig Start Date End Date Taking? Authorizing Provider  citalopram (CELEXA) 20 MG tablet Take 20 mg by mouth daily.    Historical Provider, MD  loratadine (CLARITIN) 10 MG tablet Take 10 mg by mouth daily.    Historical Provider, MD   metoprolol succinate (TOPROL-XL) 100 MG 24 hr tablet Take 100 mg by mouth daily. Take with or immediately following a meal.    Historical Provider, MD  Multiple Vitamin (MULTIVITAMIN) capsule Take 1 capsule by mouth daily.    Historical Provider, MD  ondansetron (ZOFRAN) 4 MG tablet Take 1 tablet (4 mg total) by mouth every 8 (eight) hours as needed for nausea or vomiting. 12/16/14   Joni Reining Pisciotta, PA-C  SUMAtriptan (IMITREX) 100 MG tablet Take 100 mg by mouth every 2 (two) hours as needed.    Historical Provider, MD   BP 133/73 mmHg  Pulse 112  Temp(Src) 98.9 F (37.2 C) (Oral)  Resp 20  Ht 5\' 3"  (1.6 m)  Wt 210 lb (95.255 kg)  BMI 37.21 kg/m2  SpO2 98% Physical Exam  Constitutional: She is oriented to person, place, and time. She appears well-developed and well-nourished.   ill-appearing but nontoxic, no acute distress  HENT:  Head: Normocephalic and atraumatic.  Eyes: Conjunctivae are normal. Pupils are equal, round, and reactive to light.  Neck: Neck supple.  Cardiovascular: Normal rate, regular rhythm and normal heart sounds.   Pulmonary/Chest: Effort normal and breath sounds normal.  Tenderness to palpation over sternum  Abdominal: Soft. Bowel sounds are normal. She exhibits no distension. There is no tenderness.  Musculoskeletal: She exhibits no edema.  Neurological: She is alert and oriented to person, place,  and time.  Fluent speech  Skin: Skin is warm and dry.  Psychiatric: She has a normal mood and affect. Judgment normal.  Nursing note and vitals reviewed.   ED Course  Procedures (including critical care time) Labs Review Labs Reviewed  CBC WITH DIFFERENTIAL/PLATELET - Abnormal; Notable for the following:    WBC 16.6 (*)    Neutrophils Relative % 85 (*)    Neutro Abs 14.0 (*)    Lymphocytes Relative 9 (*)    All other components within normal limits  BASIC METABOLIC PANEL - Abnormal; Notable for the following:    Glucose, Bld 129 (*)    Calcium 8.6 (*)     All other components within normal limits  CULTURE, BLOOD (ROUTINE X 2)  CULTURE, BLOOD (ROUTINE X 2)  URINE CULTURE  LIPASE, BLOOD  PREGNANCY, URINE  HEPATIC FUNCTION PANEL  LACTIC ACID, PLASMA  LACTIC ACID, PLASMA  I-STAT CG4 LACTIC ACID, ED    Imaging Review Dg Abd Acute W/chest  05/08/2015   CLINICAL DATA:  Tightness and soreness in chest, fever, had endoscopy today, clinical concern for esophageal rupture, history asthma, hypertension  EXAM: DG ABDOMEN ACUTE W/ 1V CHEST  COMPARISON:  None  FINDINGS: Normal heart size and pulmonary vascularity.  Question small hiatal hernia.  Bronchitic changes with LEFT basilar atelectasis.  Lungs otherwise clear.  No pleural effusion, pneumothorax or evidence of pneumomediastinum.  Normal bowel gas pattern.  No bowel dilatation, bowel wall thickening or free air.  Surgical clips RIGHT upper quadrant question cholecystectomy. No bowel dilatation or bowel wall thickening or free air.  Osseous structures unremarkable.  IMPRESSION: Bronchitic changes with LEFT basilar atelectasis.  Question small hiatal hernia, recommend correlation with endoscopy report.  No acute abnormalities otherwise identified.   Electronically Signed   By: Ulyses Southward M.D.   On: 05/08/2015 21:52     EKG Interpretation None      MDM   Final diagnoses:  None  chest pain Fever Pneumomediastinum   39 year old female who presents with chest pain and fever began shortly after upper endoscopy today. Patient ill-appearing but nontoxic and in no acute distress at presentation. Vital signs notable for fever of 100.8 and tachycardia at 110. The patient had sternal tenderness but no abdominal tenderness. Immediately obtained IV access, gave the patient Dilaudid and Zofran, and obtain an emergent chest x-ray and KUB to evaluate for esophageal rupture. Obtained labs listed above as well as blood and urine cultures given patient's fever.  I have reviewed the chest x-ray and I am concerned  about possible pneumomediastinum. Gave the patient imipenem given allergy to PCN and IV fluid bolus. The patient will require swallow study and possible CT scan for further evaluation of esophagus. I spoke with the thoracic surgeon on call, who recommended contacting the gastroenterology practice on call for further discussion of management. I spoke with Dr. Octaviano Glow, GI on call for patient's provider, who recommended transfer to [facility] for further work up including more imaging studies and surgical consultation. On reexamination, the patient remains mildly tachycardic but has never been hypotensive and appears stable for transport.  Patient / Family informed of clinical course, understand medical decision-making process, and agree with plan. Patient transferred to [facility] in fair condition.   CRITICAL CARE Performed by: Ambrose Finland Little   Total critical care time: 90 minutes  Critical care time was exclusive of separately billable procedures and treating other patients.  Critical care was necessary to treat or  prevent imminent or life-threatening deterioration.  Critical care was time spent personally by me on the following activities: development of treatment plan with patient and/or surrogate as well as nursing, discussions with consultants, evaluation of patient's response to treatment, examination of patient, obtaining history from patient or surrogate, ordering and performing treatments and interventions, ordering and review of laboratory studies, ordering and review of radiographic studies, pulse oximetry and re-evaluation of patient's condition.    Laurence Spates, MD 05/08/15 [phone]

## 2015-05-08 NOTE — Progress Notes (Signed)
ANTIBIOTIC CONSULT NOTE - INITIAL  Pharmacy Consult for Primaxin  Indication: intra-abdominal infection   Allergies  Allergen Reactions  . Penicillins Rash    Patient Measurements: Height:  (160 cm) Weight: 210 lb (95.255 kg) IBW/kg (Calculated) : 52.4 Adjusted Body Weight:   Vital Signs: Temp: 98.9 F (37.2 C) (07/21 2158) Temp Source: Oral (07/21 2158) BP: 133/73 mmHg (07/21 2158) Pulse Rate: 112 (07/21 2158) Intake/Output from previous day:   Intake/Output from this shift:    Labs:  Recent Labs  05/08/15 2020  WBC 16.6*  HGB 13.2  PLT 316  CREATININE 0.82   Estimated Creatinine Clearance: 100.2 mL/min (by C-G formula based on Cr of 0.82). No results for input(s): VANCOTROUGH, VANCOPEAK, VANCORANDOM, GENTTROUGH, GENTPEAK, GENTRANDOM, TOBRATROUGH, TOBRAPEAK, TOBRARND, AMIKACINPEAK, AMIKACINTROU, AMIKACIN in the last 72 hours.   Microbiology: No results found for this or any previous visit (from the past 720 hour(s)).  Medical History: Past Medical History  Diagnosis Date  . Migraines   . Asthma   . Seasonal allergies   . Hypertension     Medications:  See EMR  Assessment: Initiating abx empirically for possible intra-abdominal infection. WBC 16.6, LA 1.9, Tm 100.8.  Goal of Therapy:  Resolution of infection   Plan:  Primaxin 500 IV q6h Monitor renal fx, cultures, duration of therapy    Agapito Games, PharmD, BCPS Clinical Pharmacist Pager: [phone] 05/08/2015 10:05 PM

## 2015-05-08 NOTE — ED Notes (Signed)
[facility] here for transport, no changes.  

## 2015-05-10 LAB — URINE CULTURE: Special Requests: NORMAL

## 2015-05-14 LAB — CULTURE, BLOOD (ROUTINE X 2)
CULTURE: NO GROWTH
Culture: NO GROWTH

## 2016-03-08 ENCOUNTER — Telehealth: Payer: Self-pay | Admitting: Genetic Counselor

## 2016-03-08 ENCOUNTER — Encounter: Payer: Self-pay | Admitting: Genetic Counselor

## 2016-03-08 NOTE — Telephone Encounter (Signed)
Lt mess regarding genetic counseling appt. °

## 2016-03-08 NOTE — Telephone Encounter (Signed)
Pt called in to refer herself to genetic counseling, scheduled and comfirmed appt  Date/time, mailed new pt packet, reviewed insurance info. Completed intake

## 2016-03-18 ENCOUNTER — Other Ambulatory Visit: Payer: Self-pay

## 2016-03-18 ENCOUNTER — Encounter (HOSPITAL_BASED_OUTPATIENT_CLINIC_OR_DEPARTMENT_OTHER): Payer: Self-pay | Admitting: Emergency Medicine

## 2016-03-18 ENCOUNTER — Emergency Department (HOSPITAL_BASED_OUTPATIENT_CLINIC_OR_DEPARTMENT_OTHER): Payer: 59

## 2016-03-18 ENCOUNTER — Emergency Department (HOSPITAL_BASED_OUTPATIENT_CLINIC_OR_DEPARTMENT_OTHER)
Admission: EM | Admit: 2016-03-18 | Discharge: 2016-03-18 | Disposition: A | Discharge status: Home / Self Care | Payer: 59 | Attending: Emergency Medicine | Admitting: Emergency Medicine

## 2016-03-18 DIAGNOSIS — Z79899 Other long term (current) drug therapy: Secondary | ICD-10-CM | POA: Diagnosis not present

## 2016-03-18 DIAGNOSIS — J45909 Unspecified asthma, uncomplicated: Secondary | ICD-10-CM | POA: Diagnosis not present

## 2016-03-18 DIAGNOSIS — R0789 Other chest pain: Secondary | ICD-10-CM | POA: Diagnosis present

## 2016-03-18 DIAGNOSIS — R0602 Shortness of breath: Secondary | ICD-10-CM | POA: Diagnosis not present

## 2016-03-18 DIAGNOSIS — R5383 Other fatigue: Secondary | ICD-10-CM | POA: Diagnosis not present

## 2016-03-18 DIAGNOSIS — I10 Essential (primary) hypertension: Secondary | ICD-10-CM | POA: Insufficient documentation

## 2016-03-18 LAB — D-DIMER, QUANTITATIVE: D-Dimer, Quant: <0.27 ug/mL-FEU (ref 0.00–0.50)

## 2016-03-18 LAB — BASIC METABOLIC PANEL
Anion gap: 9 (ref 5–15)
BUN: 11 mg/dL (ref 6–20)
CO2: 22 mmol/L (ref 22–32)
Calcium: 9.1 mg/dL (ref 8.9–10.3)
Chloride: 106 mmol/L (ref 101–111)
Creatinine, Ser: 0.75 mg/dL (ref 0.44–1.00)
GFR calc Af Amer: >60 mL/min (ref >60)
Glucose, Bld: 96 mg/dL (ref 65–99)
POTASSIUM: 3.7 mmol/L (ref 3.5–5.1)
SODIUM: 137 mmol/L (ref 135–145)

## 2016-03-18 LAB — CBC WITH DIFFERENTIAL/PLATELET
BASOS PCT: 0 %
Basophils Absolute: 0 10*3/uL (ref 0.0–0.1)
EOS ABS: 0.5 10*3/uL (ref 0.0–0.7)
Eosinophils Relative: 4 %
HCT: 38.6 % (ref 36.0–46.0)
Hemoglobin: 13.4 g/dL (ref 12.0–15.0)
LYMPHS ABS: 3.3 10*3/uL (ref 0.7–4.0)
Lymphocytes Relative: 32 %
MCH: 32.4 pg (ref 26.0–34.0)
MCHC: 34.7 g/dL (ref 30.0–36.0)
MCV: 93.5 fL (ref 78.0–100.0)
Monocytes Absolute: 0.5 10*3/uL (ref 0.1–1.0)
Monocytes Relative: 5 %
Neutro Abs: 6 10*3/uL (ref 1.7–7.7)
Neutrophils Relative %: 59 %
PLATELETS: 332 10*3/uL (ref 150–400)
RBC: 4.13 MIL/uL (ref 3.87–5.11)
RDW: 13.6 % (ref 11.5–15.5)
WBC: 10.4 10*3/uL (ref 4.0–10.5)

## 2016-03-18 LAB — TROPONIN I: Troponin I: <0.03 ng/mL (ref <0.031)

## 2016-03-18 NOTE — Discharge Instructions (Signed)
Follow-up with your primary Dr. if your symptoms are not improving in the next week.   Shortness of Breath Shortness of breath means you have trouble breathing. It could also mean that you have a medical problem. You should get immediate medical care for shortness of breath. CAUSES   Not enough oxygen in the air such as with high altitudes or a smoke-filled room.  Certain lung diseases, infections, or problems.  Heart disease or conditions, such as angina or heart failure.  Low red blood cells (anemia).  Poor physical fitness, which can cause shortness of breath when you exercise.  Chest or back injuries or stiffness.  Being overweight.  Smoking.  Anxiety, which can make you feel like you are not getting enough air. DIAGNOSIS  Serious medical problems can often be found during your physical exam. Tests may also be done to determine why you are having shortness of breath. Tests may include:  Chest X-rays.  Lung function tests.  Blood tests.  An electrocardiogram (ECG).  An ambulatory electrocardiogram. An ambulatory ECG records your heartbeat patterns over a 24-hour period.  Exercise testing.  A transthoracic echocardiogram (TTE). During echocardiography, sound waves are used to evaluate how blood flows through your heart.  A transesophageal echocardiogram (TEE).  Imaging scans. Your health care provider may not be able to find a cause for your shortness of breath after your exam. In this case, it is important to have a follow-up exam with your health care provider as directed.  TREATMENT  Treatment for shortness of breath depends on the cause of your symptoms and can vary greatly. HOME CARE INSTRUCTIONS   Do not smoke. Smoking is a common cause of shortness of breath. If you smoke, ask for help to quit.  Avoid being around chemicals or things that may bother your breathing, such as paint fumes and dust.  Rest as needed. Slowly resume your usual activities.  If  medicines were prescribed, take them as directed for the full length of time directed. This includes oxygen and any inhaled medicines.  Keep all follow-up appointments as directed by your health care provider. SEEK MEDICAL CARE IF:   Your condition does not improve in the time expected.  You have a hard time doing your normal activities even with rest.  You have any new symptoms. SEEK IMMEDIATE MEDICAL CARE IF:   Your shortness of breath gets worse.  You feel light-headed, faint, or develop a cough not controlled with medicines.  You start coughing up blood.  You have pain with breathing.  You have chest pain or pain in your arms, shoulders, or abdomen.  You have a fever.  You are unable to walk up stairs or exercise the way you normally do. MAKE SURE YOU:  Understand these instructions.  Will watch your condition.  Will get help right away if you are not doing well or get worse.   This information is not intended to replace advice given to you by your health care provider. Make sure you discuss any questions you have with your health care provider.   Document Released: 06/29/2001 Document Revised: 10/09/2013 Document Reviewed: 12/20/2011 Else[employer] Interactive Patient Education [employer]

## 2016-03-18 NOTE — ED Notes (Addendum)
Patient states that she has been fatigued and SOB x 2 weeks. 2 days ago she started to have chest pain different than her asthma. [facility] from [facility] because of EKG changes

## 2016-03-18 NOTE — ED Provider Notes (Signed)
CSN: [id number]     Arrival date & time 03/18/16  2000 History  By signing my name below, I, Jamie Ellis, attest that this documentation has been prepared under the direction and in the presence of Geoffery Lyonsouglas Vaanya Shambaugh, MD . Electronically Signed: Majel HomerPeyton Ellis, Scribe. 03/18/2016. 9:48 PM.   Chief Complaint  Patient presents with  . Chest Pain   Patient is a 40 y.o. female presenting with chest pain. The history is provided by the patient. No language interpreter was used.  Chest Pain Pain location:  Substernal area Pain quality: tightness   Pain radiates to:  Does not radiate Pain radiates to the back: no   Pain severity:  Mild Onset quality:  Gradual Duration:  2 weeks Timing:  Constant Progression:  Worsening Chronicity:  New Context: breathing   Relieved by:  Nothing Worsened by:  Nothing tried Ineffective treatments:  None tried Associated symptoms: cough, fatigue and shortness of breath   Risk factors: birth control and hypertension    HPI Comments:  Jamie Ellis is a 40 y.o. female with PMHx of GERD, asthma, migraines, and HTN, who presents to the Emergency Department complaining of gradual onset, gradually worsening, non-radiating, central chest tightness onset 2 weeks ago that worsened 2 days ago. Pt reports associated symptoms of shortness of breath, productive cough, severe fatigue, and collection of fluids in her stomach. Pt states that normal every day activities, such as walking up the stairs, causes her to feel out of breath easily. Pt notes pain is worsened when she breathes deeply. She reports this is the first time she has experienced these symptoms. Pt denies swelling in her calves, pain in legs, PMHx of any blood problems and anemia, abnormal vaginal discharge, irregular menstrual period and melena.   Past Medical History  Diagnosis Date  . Migraines   . Asthma   . Seasonal allergies   . Hypertension    Past Surgical History  Procedure Laterality Date  . Cholecystectomy     . Appendectomy    . Nasal sinus surgery     History reviewed. No pertinent family history. Social History  Substance Use Topics  . Smoking status: Never Smoker   . Smokeless tobacco: Never Used  . Alcohol Use: 1.2 oz/week    2 Glasses of wine per week   OB History    No data available     Review of Systems  Constitutional: Positive for fatigue.  Respiratory: Positive for cough and shortness of breath.   Cardiovascular: Positive for chest pain.  10 systems reviewed and all are negative for acute change except as noted in the HPI.  Allergies  Penicillins  Home Medications   Prior to Admission medications   Medication Sig Start Date End Date Taking? Authorizing Provider  citalopram (CELEXA) 20 MG tablet Take 20 mg by mouth daily.    Historical Provider, MD  loratadine (CLARITIN) 10 MG tablet Take 10 mg by mouth daily.    Historical Provider, MD  metoprolol succinate (TOPROL-XL) 100 MG 24 hr tablet Take 100 mg by mouth daily. Take with or immediately following a meal.    Historical Provider, MD  Multiple Vitamin (MULTIVITAMIN) capsule Take 1 capsule by mouth daily.    Historical Provider, MD  ondansetron (ZOFRAN) 4 MG tablet Take 1 tablet (4 mg total) by mouth every 8 (eight) hours as needed for nausea or vomiting. 12/16/14   Joni ReiningNicole Pisciotta, PA-C  SUMAtriptan (IMITREX) 100 MG tablet Take 100 mg by mouth every 2 (two) hours as  needed.    Historical Provider, MD   Triage Vitals: BP 156/96 mmHg  Pulse 87  Temp(Src) 98.8 F (37.1 C) (Oral)  Resp 18  Ht  (1.6 m)  Wt 214 lb (97.07 kg)  BMI 37.92 kg/m2  SpO2 97%  LMP 02/29/2016 Physical Exam  Constitutional: She is oriented to person, place, and time. She appears well-developed and well-nourished.  HENT:  Head: Normocephalic and atraumatic.  Eyes: Conjunctivae are normal. Right eye exhibits no discharge. Left eye exhibits no discharge.  Neck: Normal range of motion. Neck supple. No tracheal deviation present.   Cardiovascular: Normal rate and regular rhythm.   Pulmonary/Chest: Effort normal and breath sounds normal.  Abdominal: Soft. She exhibits no distension. There is no tenderness. There is no guarding.  Musculoskeletal: She exhibits no edema.  No edema or calf swelling.  Homan sign absent bilaterally.   Neurological: She is alert and oriented to person, place, and time.  Skin: Skin is warm. No rash noted.  Psychiatric: She has a normal mood and affect.  Nursing note and vitals reviewed.   ED Course  Procedures  DIAGNOSTIC STUDIES:  Oxygen Saturation is 97% on RA, normal by my interpretation.    COORDINATION OF CARE:  9:39 PM Discussed treatment plan, which includes blood and electrolyte tests with pt at bedside and pt agreed to plan.  Labs Review Labs Reviewed - No data to display  Imaging Review No results found. I have personally reviewed and evaluated these images and lab results as part of my medical decision-making.  ED ECG REPORT   Date: 03/18/2016  Rate: 87  Rhythm: normal sinus rhythm  QRS Axis: normal  Intervals: normal  ST/T Wave abnormalities: normal  Conduction Disutrbances:none  Narrative Interpretation:   Old EKG Reviewed: none available  I have personally reviewed the EKG tracing and agree with the computerized printout as noted.   MDM   Final diagnoses:  None    Patient presents with complaints of shortness of breath. She states that this occurs with exertion. This is been ongoing for the past 2 weeks. She also reports intermittent chest discomfort. She was sent here from urgent care over concerns of her EKG. Her EKG here is unremarkable and workup reveals no evidence for a cardiac etiology. Troponin is negative and remainder laboratory studies are also normal. I have also considered pulmonary embolism, however there is no hypoxia or tachycardia, and d-dimer is negative. She will be discharged to home with instructions to follow-up with her primary Dr.  if not improving in the next week.  I personally performed the services described in this documentation, which was scribed in my presence. The recorded information has been reviewed and is accurate.       Geoffery Lyons, MD 03/18/16 [phone]

## 2016-03-25 IMAGING — CT CT HEAD W/O CM
2 series · 16 of 30 positions shown, 18 images · non-contrast
Comparison: None.

CLINICAL DATA: Fell over a puppy immediately prior to arrival. Face
injury, RIGHT supraorbital laceration. No loss of consciousness.

EXAM:
CT HEAD WITHOUT CONTRAST
TECHNIQUE: Contiguous axial images were obtained from the base of the skull
through the vertex without intravenous contrast.

[Series 2: head 4.8 h37s · axial · 0.44mm/px · z∈[-129,-12]mm · 8 of 32 slices shown, 10 images]
[im 4/32  brain]
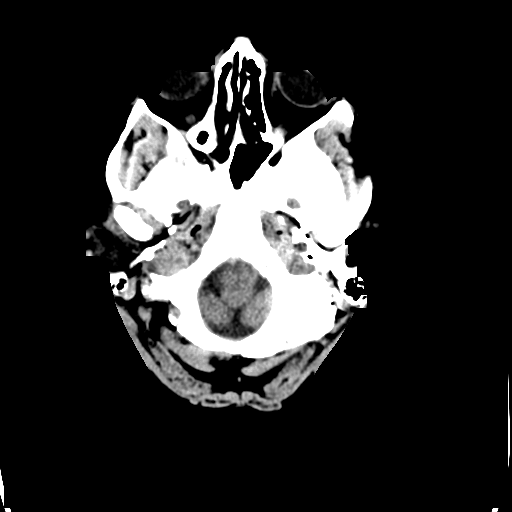
[im 4/32  bone]
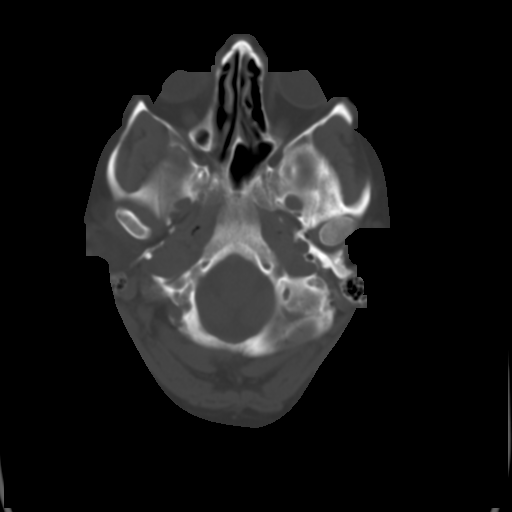
[im 7/32  brain]
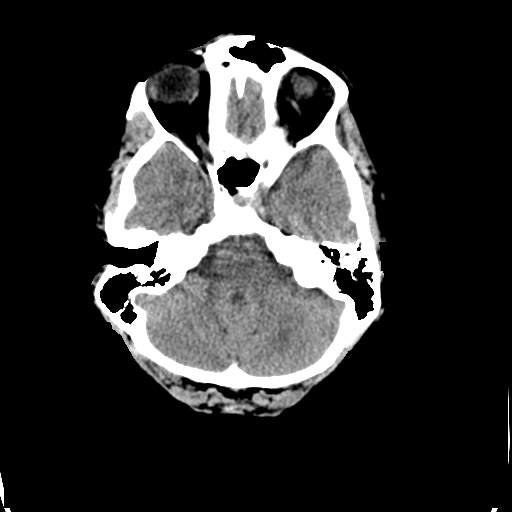
[im 11/32  brain]
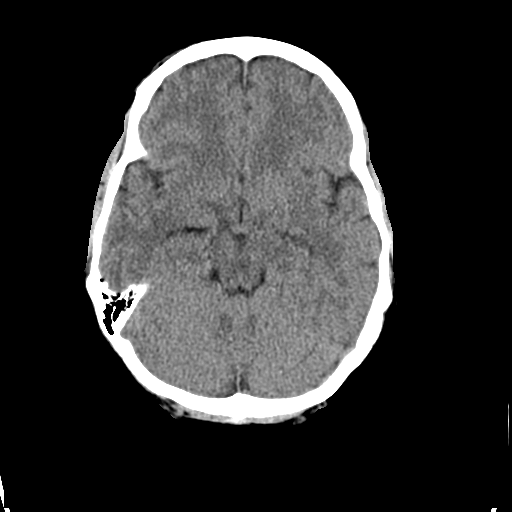
[im 14/32  brain]
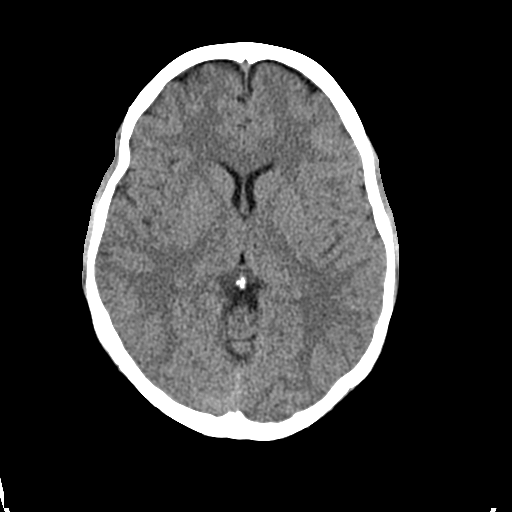
[im 18/32  brain]
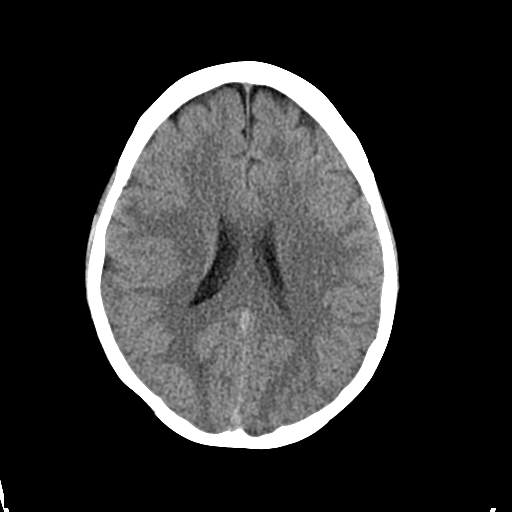
[im 18/32  bone]
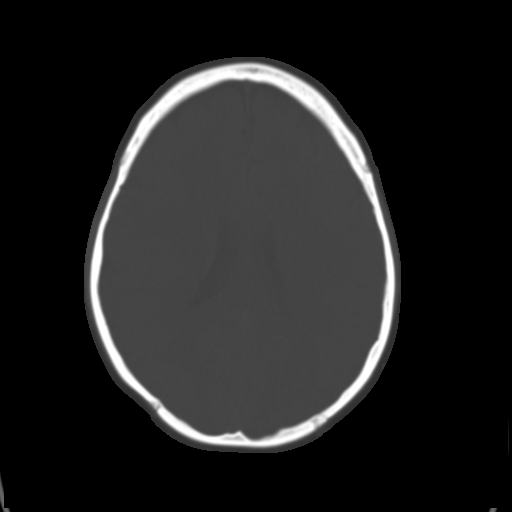
[im 21/32  brain]
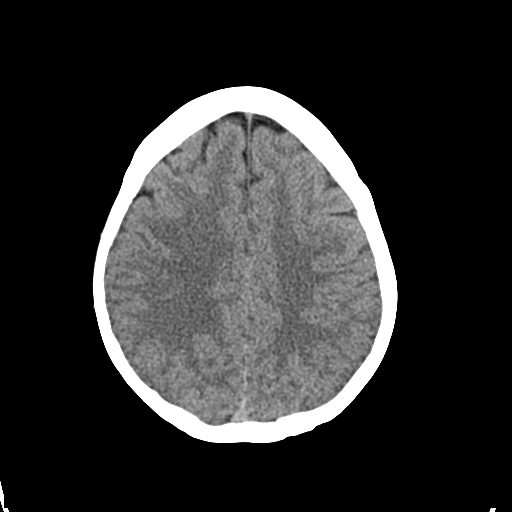
[im 25/32  brain]
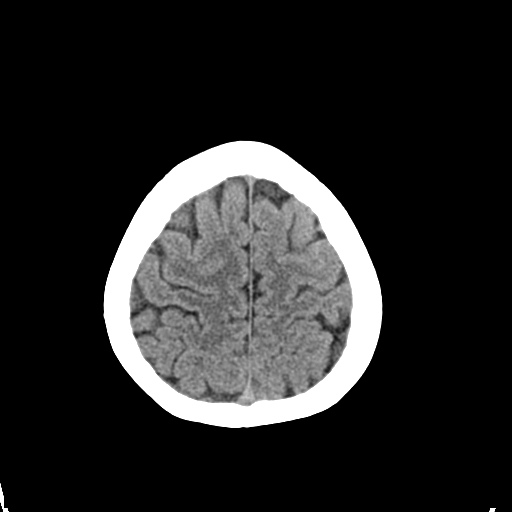
[im 28/32  brain]
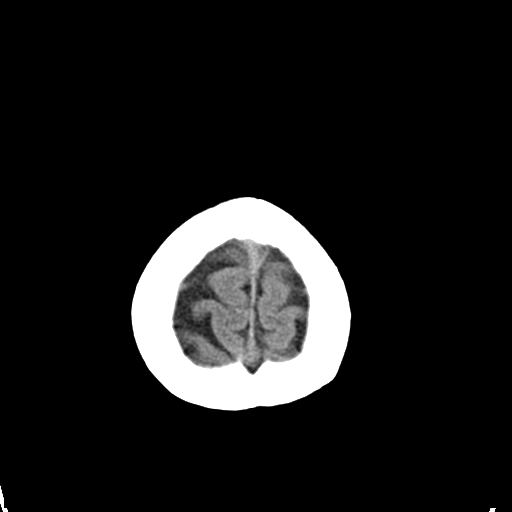

[Series 3: head 2.4 h60s bone · axial · 0.44mm/px · z∈[-130,-9]mm · 8 of 64 slices shown]
[im 7/64  bone]
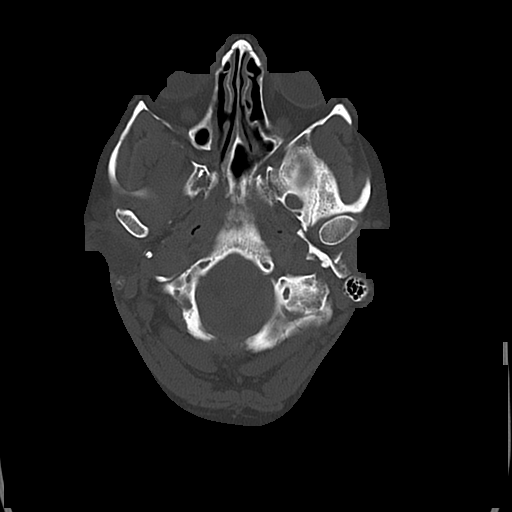
[im 14/64  bone]
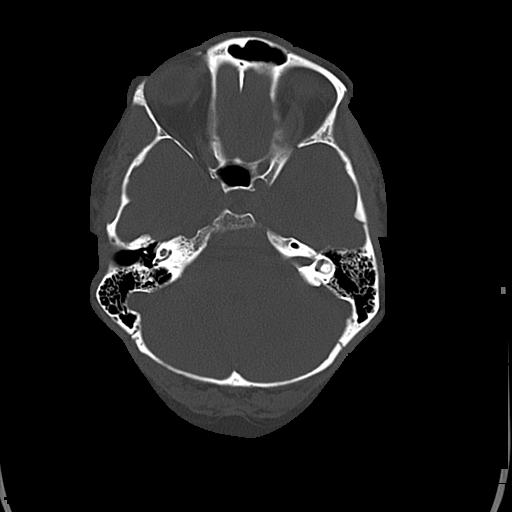
[im 20/64  bone]
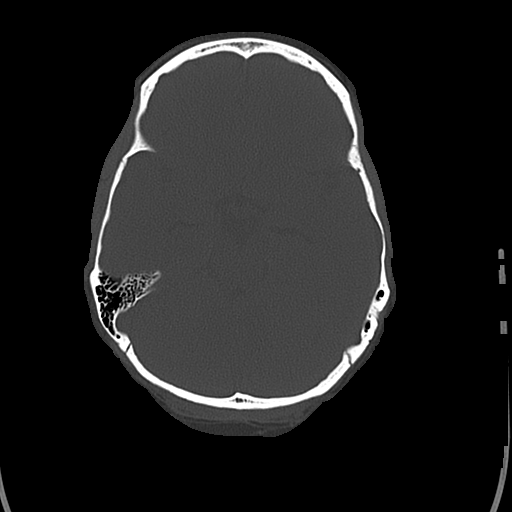
[im 27/64  bone]
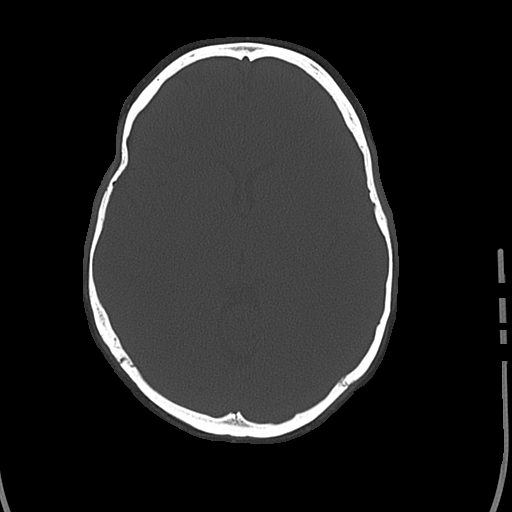
[im 37/64  bone]
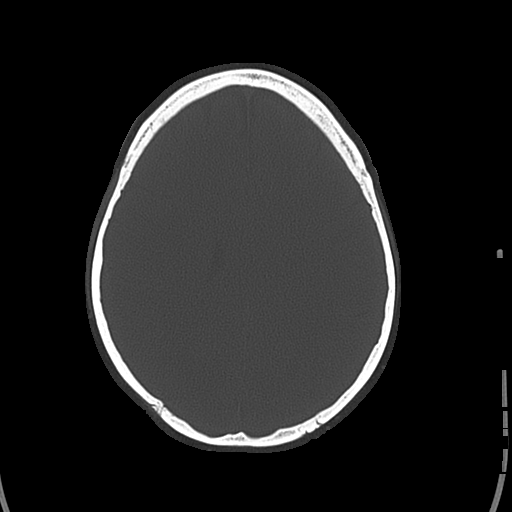
[im 44/64  bone]
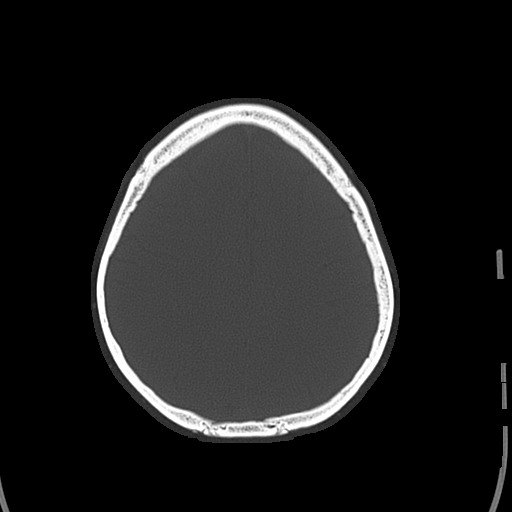
[im 50/64  bone]
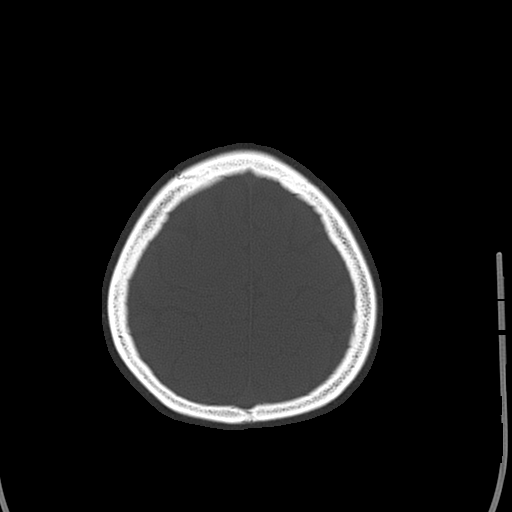
[im 57/64  bone]
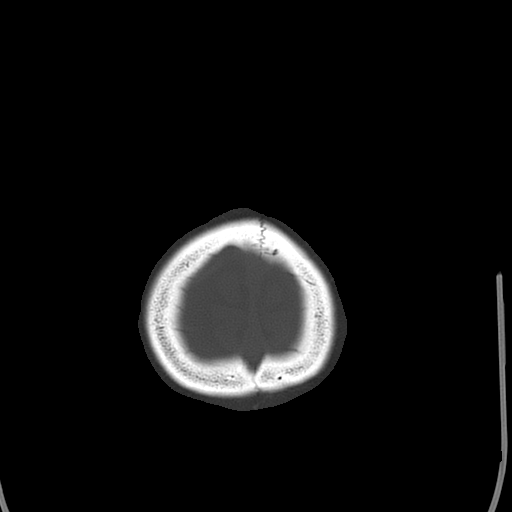

[16 of 30 positions shown; findings below may reference images not displayed]

FINDINGS: The ventricles and sulci are normal. No intraparenchymal hemorrhage,
mass effect nor midline shift. No acute large vascular territory
infarcts.

No abnormal extra-axial fluid collections. Basal cisterns are
patent.

Small RIGHT frontal scalp hematoma without subcutaneous gas or
radiopaque foreign bodies. No skull fracture. The included ocular
globes and orbital contents are non-suspicious. Mild paranasal sinus
mucosal thickening without air-fluid levels. The mastoid air cells
are well aerated.
IMPRESSION: Small RIGHT frontal scalp hematoma.  No skull fracture.

Normal noncontrast CT of the head.

  By: Gertjie Oliphant

## 2016-04-09 ENCOUNTER — Encounter: Payer: Self-pay | Admitting: Genetic Counselor

## 2016-04-19 ENCOUNTER — Encounter: Payer: Self-pay | Admitting: Genetic Counselor

## 2016-04-19 ENCOUNTER — Ambulatory Visit (HOSPITAL_BASED_OUTPATIENT_CLINIC_OR_DEPARTMENT_OTHER): Payer: 59 | Admitting: Genetic Counselor

## 2016-04-19 ENCOUNTER — Other Ambulatory Visit: Payer: 59

## 2016-04-19 DIAGNOSIS — Z315 Encounter for genetic counseling: Secondary | ICD-10-CM | POA: Diagnosis not present

## 2016-04-19 DIAGNOSIS — Z800 Family history of malignant neoplasm of digestive organs: Secondary | ICD-10-CM | POA: Diagnosis not present

## 2016-04-19 NOTE — Progress Notes (Signed)
REFERRING PROVIDER: Quitman Livings, MD  Serita Grammes, MD  PRIMARY PROVIDER:  Pcp Not In System  PRIMARY REASON FOR VISIT:  1. Family history of Lynch syndrome   2. Family history of rectal cancer      HISTORY OF PRESENT ILLNESS:   Jamie Ellis, a 40 y.o. female, was seen for a [facility] cancer genetics consultation at the request of Dr. Quentin Cornwall due to a family history of cancer.  Jamie Ellis presents to clinic today to discuss the possibility of a hereditary predisposition to cancer, genetic testing, and to further clarify her future cancer risks, as well as potential cancer risks for family members. Jamie Ellis is a 40 y.o. female with no personal history of cancer.  Her father had rectal bleeding last year and in October was diagnosed with rectal cancer.  Through tumor testing, he was found to have MSH6 LOH, and through genetic testing at [facility] he was found to have an MSH6 mutation.  Testing was through [facility].  CANCER HISTORY:   No history exists.     HORMONAL RISK FACTORS:  Menarche was at age 40.  First live birth at age 75.  OCP use for approximately 2 years.  Ovaries intact: yes.  Hysterectomy: no.  Menopausal status: premenopausal.  HRT use: 0 years. Colonoscopy: no; not examined. Mammogram within the last year: no. Number of breast biopsies: 0. Up to date with pelvic exams:  yes. Any excessive radiation exposure in the past:  no  Past Medical History  Diagnosis Date  . Migraines   . Asthma   . Seasonal allergies   . Hypertension   . Family history of Lynch syndrome   . Family history of rectal cancer     Past Surgical History  Procedure Laterality Date  . Cholecystectomy    . Appendectomy    . Nasal sinus surgery      Social History   Social History  . Marital Status: Married    Spouse Name: Hilliard Clark  . Number of Children: 2  . Years of Education: N/A   Social History Main Topics  . Smoking status: Never Smoker   . Smokeless tobacco: Never Used  .  Alcohol Use: 1.2 oz/week    2 Glasses of wine per week  . Drug Use: No  . Sexual Activity: Yes    Birth Control/ Protection: Implant   Other Topics Concern  . None   Social History Narrative     FAMILY HISTORY:  We obtained a detailed, 4-generation family history.  Significant diagnoses are listed below: Family History  Problem Relation Age of Onset  . Rectal cancer Father 49    MSH6 - Lynch syndrome  . Skin cancer Paternal Grandmother   . Prostate cancer Paternal Grandfather 33    The patient has a son and daughter who are cancer free.  She ha a brother and sister who do not have cancer.  Her sister has an appointment to get genetic testing for the known family mutation [facility]) in [place], but her brother ha chosen to deal with this through homeopathy.  Her father was diagnosed with rectal cancer at 36 and found to have Lynch syndrome.  He has two sisters who are cancer free.  His mother had skin cancer and a first cousin with colon cancer in his 91s.  His father had prostate cancer at 14 and died at 35.  The patinet's mother has one sister who is cancer free.  Her parents are both deceased, from non  cancer related illnesses.  Patient's maternal ancestors are of [place] descent, and paternal ancestors are of [place] and [place] descent. There is no reported Ashkenazi Jewish ancestry. There is no known consanguinity.  GENETIC COUNSELING ASSESSMENT: Jamie Ellis is a 40 y.o. female with a family history of rectal cancer and a KFM in MSH6 which is causitive of a Lynch syndrome and predisposition to cancer. We, therefore, discussed and recommended the following at today's visit.   DISCUSSION: We discussed that her father had genetic testing of all 5 Lynch genes and that he only had a mutation in MSH6.  Since there is no cancer on her mothers side of the family, and her father's side has relatively few famly members with cancer, we would only look for the [facility] in MSH6. Her risk for testing positive  for this mutation is 50%.  If she tests positive, we will discuss with her on how we would recommend her to manage her medical care. If she is negative, then we would make recommendations based on population screening.  We reviewed the characteristics, features and inheritance patterns of hereditary cancer syndromes. We also discussed genetic testing, including the appropriate family members to test, the process of testing, insurance coverage and turn-around-time for results. We discussed the implications of a negative, positive and/or variant of uncertain significant result. We recommended Jamie Ellis pursue genetic testing for the MSH6 [place] called [id number].   Based on Jamie Ellis's family history of cancer, and KFM in MSH6, she meets medical criteria for genetic testing. Despite that she meets criteria, she may still have an out of pocket cost. We discussed that if her out of pocket cost for testing is over $100, the laboratory will call and confirm whether she wants to proceed with testing.  If the out of pocket cost of testing is less than $100 she will be billed by the genetic testing laboratory.   PLAN: After considering the risks, benefits, and limitations, Jamie Ellis  provided informed consent to pursue genetic testing and the blood sample was sent to [facility] for analysis of the MSH6 gene. Results should be available within approximately 2-3 weeks' time, at which point they will be disclosed by telephone to Jamie Ellis, as will any additional recommendations warranted by these results. Jamie Ellis will receive a summary of her genetic counseling visit and a copy of her results once available. This information will also be available in Epic. We encouraged Jamie Ellis to remain in contact with cancer genetics annually so that we can continuously update the family history and inform her of any changes in cancer genetics and testing that may be of benefit for her family. Jamie Ellis questions were  answered to her satisfaction today. Our contact information was provided should additional questions or concerns arise.  Lastly, we encouraged Jamie Ellis to remain in contact with cancer genetics annually so that we can continuously update the family history and inform her of any changes in cancer genetics and testing that may be of benefit for this family.   Ms.  Ellis questions were answered to her satisfaction today. Our contact information was provided should additional questions or concerns arise. Thank you for the referral and allowing [place] to share in the care of your patient.   Jamie Ellis P. Florene Glen, [place], [facility] Certified Genetic Counselor [email or web address] phone: [phone]  The patient was seen for a total of 35 minutes in face-to-face genetic counseling.  This patient was discussed with Drs. Magrinat, Lindi Adie and/or Burr Medico who agrees  with the above.    _______________________________________________________________________ For Office Staff:  Number of people involved in session: 1 Was an Intern/ student involved with case: no

## 2016-05-05 ENCOUNTER — Telehealth: Payer: Self-pay | Admitting: Genetic Counselor

## 2016-05-05 NOTE — Telephone Encounter (Signed)
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  This confirms a dx of Lynch syndrome. Discussed that the goal of testing is to allow for increased screening to prevent cancer from occuring or identify a cancer early when more treatable.  Patient will be seen on 05/06/2016 for genetic counseling.

## 2016-05-06 ENCOUNTER — Ambulatory Visit (HOSPITAL_BASED_OUTPATIENT_CLINIC_OR_DEPARTMENT_OTHER): Payer: 59 | Admitting: Genetic Counselor

## 2016-05-06 DIAGNOSIS — Z315 Encounter for genetic counseling: Secondary | ICD-10-CM

## 2016-05-06 DIAGNOSIS — Z808 Family history of malignant neoplasm of other organs or systems: Secondary | ICD-10-CM

## 2016-05-06 DIAGNOSIS — Z1379 Encounter for other screening for genetic and chromosomal anomalies: Secondary | ICD-10-CM | POA: Insufficient documentation

## 2016-05-06 DIAGNOSIS — Z800 Family history of malignant neoplasm of digestive organs: Secondary | ICD-10-CM | POA: Diagnosis not present

## 2016-05-06 DIAGNOSIS — Z1509 Genetic susceptibility to other malignant neoplasm: Secondary | ICD-10-CM | POA: Insufficient documentation

## 2016-05-06 NOTE — Progress Notes (Addendum)
REFERRING PROVIDER: Quitman Livings, MD  Serita Grammes, MD  PRIMARY PROVIDER:  Pcp Not In System  PRIMARY REASON FOR VISIT:  1. Lynch syndrome   2. Genetic testing   3. Family history of Lynch syndrome   4. Family history of rectal cancer    HPI: Jamie Ellis was previously seen in the [place] clinic due to a family of cancer and concerns regarding a hereditary predisposition to cancer. Please refer to our prior cancer genetics clinic note for more information regarding Jamie Ellis's medical, social and family histories, and our assessment and recommendations, at the time. Jamie Ellis recent genetic test results were disclosed to her, as were recommendations warranted by these results. These results and recommendations are discussed in more detail below.   FAMILY HISTORY:  We obtained a detailed, 4-generation family history.  Significant diagnoses are listed below: Family History  Problem Relation Age of Onset  . Rectal cancer Father 63    MSH6 - Lynch syndrome  . Skin cancer Paternal Grandmother   . Prostate cancer Paternal Grandfather 43    The patient has a son and daughter who are cancer free. She ha a brother and sister who do not have cancer. Her sister has an appointment to get genetic testing for the known family mutation [facility]) in [place], but her brother ha chosen to deal with this through homeopathy. Her father was diagnosed with rectal cancer at 48 and found to have Lynch syndrome. He has two sisters who are cancer free. His mother had skin cancer and a first cousin with colon cancer in his 39s. His father had prostate cancer at 44 and died at 29. The patinet's mother has one sister who is cancer free. Her parents are both deceased, from non cancer related illnesses. Patient's maternal ancestors are of [place] descent, and paternal ancestors are of [place] and [place] descent. There is no reported Ashkenazi Jewish ancestry. There is no known  consanguinity.  GENETIC TEST RESULTS: We recommended Jamie Ellis pursue testing for the familial hereditary cancer gene mutation called MSH6, [id number]. 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 A copy of the test report has been scanned into Epic for review.   SCREENING RECOMMENDATIONS: We discussed the implications of Lynch syndrome for Jamie Ellis, and discussed who else in the family should have genetic testing. We recommended Jamie Ellis follow management guidelines for Lynch syndrome; all of which are outlined below. These can be coordinated by Jamie Ellis's GI doctor or her primary provider.   1. Annual colonoscopy.   2. While there is no clear evidence to support screening for stomach and small bowel cancer, an upper endoscopy can be considered at 3-5 year intervals beginning at age 84-35. However, whether to have this screening is best determined by the gastroenterologist.   3. Annual urinalysis.   For women with Lynch syndrome, unlike the effective surveillance plan for colorectal cancer risk, there is no professional agreement regarding management for the increased risk of uterine and ovarian cancer. However, we are available to help women and their providers establish an individualized surveillance plan. It is also important for women to understand the following:   1. Women should seek medical attention if they experience abnormal vaginal bleeding.  2. Some providers may still recommend vaginal ultrasounds, uterine biopsies (for uterine cancer risk) and/or CA-125 analysis ( for ovarian cancer risk), even though these have not been shown to be effective.  3. A  hysterectomy with removal of the ovaries and fallopian tubes should be considered once childbearing is completed (if planned).  FAMILY MEMBERS: Since we now know the mutation in Jamie Ellis, we can test at-risk relatives to determine whether or not they  have inherited the mutation and are at increased risk for cancer. We will be happy to meet with any of the family members or refer them to a genetic counselor in their local area.  Jamie Ellis' children are have a 50% chance to have inherited this mutation. However, they are relatively young and this will not be of any consequence to them for several years. We do not test children because there is no risk to them until they are adults. We recommend they have genetic counseling and testing by the time they are in their early 20s.    Jamie Ellis' siblings have a 50% chance to have inherited this mutation. We recommend they have genetic testing for this same mutation, as identifying the presence of this mutation would allow them to also take advantage of risk-reducing measures. To locate genetic counselors in other cities, individuals can visit the website of the [employer] (ArtistMovie.se) and Secretary/administrator for a Social worker by zip code.   SUPPORT AND RESOURCES: Jamie Ellis' reports joining a Facebook group for [employer] syndrome.  Another support group that provides information about Lynch syndrome and also has support group meetings nationally is called It Takes Guts.  Jamie Ellis' was provided information about the [facility] cancer registry through [facility] as well.  We strongly encouraged Jamie Ellis to remain in contact with [place] in cancer genetics on an annual basis so we can update Jamie Ellis's personal and family histories, and inform her of advances in cancer genetics that may be of benefit for the entire family. Jamie Ellis knows she is also welcome to call with any questions or concerns, at any time.   Roma Kayser, MS, [facility]  Certified Genetic Counselor  [phone]

## 2016-07-14 ENCOUNTER — Other Ambulatory Visit (HOSPITAL_COMMUNITY): Payer: Self-pay | Admitting: General Surgery

## 2016-07-14 DIAGNOSIS — E669 Obesity, unspecified: Secondary | ICD-10-CM

## 2016-07-28 ENCOUNTER — Encounter (HOSPITAL_COMMUNITY): Payer: Self-pay | Admitting: Radiology

## 2016-07-28 ENCOUNTER — Ambulatory Visit (HOSPITAL_COMMUNITY)
Admission: RE | Admit: 2016-07-28 | Discharge: 2016-07-28 | Disposition: A | Discharge status: Home / Self Care | Payer: 59 | Source: Ambulatory Visit | Attending: General Surgery | Admitting: General Surgery

## 2016-07-28 DIAGNOSIS — K449 Diaphragmatic hernia without obstruction or gangrene: Secondary | ICD-10-CM | POA: Diagnosis not present

## 2016-07-28 DIAGNOSIS — E669 Obesity, unspecified: Secondary | ICD-10-CM

## 2016-08-05 ENCOUNTER — Encounter: Payer: Self-pay | Admitting: Skilled Nursing Facility1

## 2016-08-05 ENCOUNTER — Encounter: Payer: 59 | Attending: General Surgery | Admitting: Skilled Nursing Facility1

## 2016-08-05 DIAGNOSIS — Z713 Dietary counseling and surveillance: Secondary | ICD-10-CM | POA: Insufficient documentation

## 2016-08-05 DIAGNOSIS — E6609 Other obesity due to excess calories: Secondary | ICD-10-CM

## 2016-08-05 NOTE — Progress Notes (Signed)
  Pre-Op Assessment Visit:  Pre-Operative Roux-En-Y Bypass Surgery  Medical Nutrition Therapy:  Appt start time: 7:55   End time:  8:55  Patient was seen on 08/05/2016 for Pre-Operative Nutrition Assessment. [facility] on 08/05/2016.  Pt states the main reason for the surgery is to get off of medications for her horribly painful GERD. Preferred Learning Style:   No preference indicated   Learning Readiness:   Change in progress  Handouts given during visit include:  Pre-Op Goals Bariatric Surgery Protein Shakes  During the appointment today the following Pre-Op Goals were reviewed with the patient: Maintain or lose weight as instructed by your surgeon Make healthy food choices Begin to limit portion sizes Limited concentrated sugars and fried foods Keep fat/sugar in the single digits per serving on  food labels Practice CHEWING your food  (aim for 30 chews per bite or until applesauce consistency) Practice not drinking 15 minutes before, during, and 30 minutes after each meal/snack Avoid all carbonated beverages  Avoid/limit caffeinated beverages  Avoid all sugar-sweetened beverages Consume 3 meals per day; eat every 3-5 hours Make a list of non-food related activities Aim for 64-100 ounces of FLUID daily  Aim for at least 60-80 grams of PROTEIN daily Look for a liquid protein source that contain ?15 g protein and ?5 g carbohydrate  (ex: shakes, drinks, shots)  Patient-Centered Goals: 10/10 specific/non-scale and confidence/importance scale 1-10  Demonstrated degree of understanding via:  Teach Back  Teaching Method Utilized:  Visual Auditory Hands on  Barriers to learning/adherence to lifestyle change: none identified  Patient to call the [facility] to enroll in Pre-Op and Post-Op Nutrition Education when surgery date is scheduled.

## 2016-08-05 NOTE — Patient Instructions (Signed)
Follow Pre-Op Goals Try Protein Shakes Call [facility] at [phone] when surgery is scheduled to enroll in Pre-Op Class  Things to remember:  Please always be honest with us. We want to support you!  If you have any questions or concerns in between appointments, please call or email Liz, Leslie, or Laurie.  The diet after surgery will be high protein and low in carbohydrate.  Vitamins and calcium need to be taken for the rest of your life.  Feel free to include support people in any classes or appointments.   Supplement recommendations:  Before Surgery   1 Complete Multivitamin with Iron  3000 IU Vitamin D3  After Surgery   2 Chewable Multivitamins  **Best Choice - Bariatric Advantage Advanced Multi EA      3 Chewable Calcium (500 mg each, total 1200-1500 mg per day)  **Best Choice - Celebrate, Bariatric Advantage, or Wellesse  Other Options:    2 Flinstones Complete + up to 100 mg Thiamin + 2000-3000 IU Vitamin D3 + 350-500 mcg Vitamin B12 + 30-45 mg Iron (with history of deficiency)  2 Celebrate MultiComplete with 18 mg Iron (this provides 6000 IU of  Vitamin D3)  4 Celebrate Essential Multi 2 in 1 (has calcium) + 18-60 mg separate  iron  Vitamins and Calcium are available at:   [facility]   [address], [place], [place] [address]   [email or web address]  [email or web address]  [email or web address]   

## 2016-08-30 ENCOUNTER — Encounter: Payer: 59 | Attending: General Surgery | Admitting: Skilled Nursing Facility1

## 2016-08-30 ENCOUNTER — Encounter: Payer: Self-pay | Admitting: Skilled Nursing Facility1

## 2016-08-30 DIAGNOSIS — Z713 Dietary counseling and surveillance: Secondary | ICD-10-CM | POA: Diagnosis not present

## 2016-08-30 DIAGNOSIS — E6609 Other obesity due to excess calories: Secondary | ICD-10-CM

## 2016-08-30 NOTE — Progress Notes (Signed)
  Medical Nutrition Therapy:  Appt start time: 7:55   End time:  8:55  Patient is getting Roux-En-Y. Pt states Chewing has helped with the GERD symptoms.Pt states she is working on chewing but not accomplished. Pt states she is reading the wt loss surgery for dummies but is ignoring the food sections. Pt states she is having trouble with Not drinking during meals. Pt states she has avoided carbonated  and drinking caf coffee. Pt state she is Drinking fluid with a big bottle 24 ounces to make she is drinking at least 64 ounces. Pt states her phycologist recommended she make her self sick when eating after surgery so she does not do it again: Dietitian advised against this with education as to why. Pt states she experienced that enough with GERD so that will not be an issue for her. Pt states she is Eating 3 meals a day and trying to eat more protein:  Boiled egg and fruit Meat and cheese and fruit peanutbutter crackers Protein with veggies  Physical activity: trying to get 30 minutes 3 times a week of walking  Wt: 220.6 pounds BMI: 39.08  Preferred Learning Style:   No preference indicated   Learning Readiness:   Change in progress  Handouts given during visit include:  Pre-Op Goals Bariatric Surgery Protein Shakes  During the appointment today the following Pre-Op Goals were reviewed with the patient: Maintain or lose weight as instructed by your surgeon Make healthy food choices Begin to limit portion sizes Limited concentrated sugars and fried foods Keep fat/sugar in the single digits per serving on  food labels Practice CHEWING your food  (aim for 30 chews per bite or until applesauce consistency) Practice not drinking 15 minutes before, during, and 30 minutes after each meal/snack Avoid all carbonated beverages  Avoid/limit caffeinated beverages  Avoid all sugar-sweetened beverages Consume 3 meals per day; eat every 3-5 hours Make a list of non-food related activities Aim  for 64-100 ounces of FLUID daily  Aim for at least 60-80 grams of PROTEIN daily Look for a liquid protein source that contain ?15 g protein and ?5 g carbohydrate  (ex: shakes, drinks, shots)  Patient-Centered Goals: 10/10 specific/non-scale and confidence/importance scale 1-10  Demonstrated degree of understanding via:  Teach Back  Teaching Method Utilized:  Visual Auditory Hands on  Barriers to learning/adherence to lifestyle change: none identified  [facility] to enroll in Pre-Op and Post-Op Nutrition Education when surgery date is scheduled.

## 2016-09-02 ENCOUNTER — Ambulatory Visit: Payer: Self-pay | Admitting: General Surgery

## 2016-09-27 ENCOUNTER — Encounter: Payer: 59 | Attending: General Surgery | Admitting: Skilled Nursing Facility1

## 2016-09-27 DIAGNOSIS — Z713 Dietary counseling and surveillance: Secondary | ICD-10-CM | POA: Diagnosis present

## 2016-09-27 DIAGNOSIS — E6609 Other obesity due to excess calories: Secondary | ICD-10-CM

## 2016-09-27 NOTE — Progress Notes (Signed)
  Medical Nutrition Therapy:  Appt start time: 7:55   End time:  8:55  Patient is getting Roux-En-Y. Pt returns having gained 2 pounds since her last visit. Pt states she ate everything she wanted for thanksgiving but much much smaller portions. Pt states she has been stopping eating before she is overly full.   Pt states she is Eating 3 meals a day and trying to eat more protein:  Shake Meat and cheese and fruit or vegetables peanutbutter crackers Protein with veggies and potatoes   24 ounces of water by lunch and then before home and then at night another 24   Physical activity:getting 30 minutes 3 times a week of walking  Wt: 222.6 pounds BMI: 39.43  Preferred Learning Style:   No preference indicated   Learning Readiness:   Change in progress  Handouts given during visit include:  Pre-Op Goals Bariatric Surgery Protein Shakes  During the appointment today the following Pre-Op Goals were reviewed with the patient: Maintain or lose weight as instructed by your surgeon Make healthy food choices Begin to limit portion sizes Limited concentrated sugars and fried foods Keep fat/sugar in the single digits per serving on  food labels Practice CHEWING your food  (aim for 30 chews per bite or until applesauce consistency) Practice not drinking 15 minutes before, during, and 30 minutes after each meal/snack Avoid all carbonated beverages  Avoid/limit caffeinated beverages  Avoid all sugar-sweetened beverages Consume 3 meals per day; eat every 3-5 hours Make a list of non-food related activities Aim for 64-100 ounces of FLUID daily  Aim for at least 60-80 grams of PROTEIN daily Look for a liquid protein source that contain ?15 g protein and ?5 g carbohydrate  (ex: shakes, drinks, shots)  Patient-Centered Goals: 10/10 specific/non-scale and confidence/importance scale 1-10  Demonstrated degree of understanding via:  Teach Back  Teaching Method Utilized:   Visual Auditory Hands on  Barriers to learning/adherence to lifestyle change: none identified  [facility] to [facility] the [facility] to enroll in Pre-Op and Post-Op Nutrition Education when surgery date is scheduled.

## 2016-10-21 ENCOUNTER — Encounter: Payer: Self-pay | Admitting: Skilled Nursing Facility1

## 2016-10-21 ENCOUNTER — Encounter: Payer: 59 | Attending: General Surgery | Admitting: Skilled Nursing Facility1

## 2016-10-21 DIAGNOSIS — E6609 Other obesity due to excess calories: Secondary | ICD-10-CM

## 2016-10-21 DIAGNOSIS — Z713 Dietary counseling and surveillance: Secondary | ICD-10-CM | POA: Diagnosis not present

## 2016-10-21 NOTE — Progress Notes (Signed)
  Medical Nutrition Therapy:  Appt start time: 7:55   End time:  8:55  Patient is getting Roux-En-Y. Pt returns having gained 2 pounds. Pt states she has had a cold for the last 2 weeks and due to the holidays it has made it more difficult to stay on track. Pt states she has had to have her esophagus stretched. Pt states due to not having a drink with her means she has had to chew significantly more. Pt states she has been drinking more fluid but has not been walking due to the weather.   Shake Meat and cheese and fruit or vegetables peanutbutter crackers Protein with veggies and potatoes   24 ounces of water by lunch and then before home and then at night another 24   Physical activity:getting 30 minutes 3 times a week of walking  Wt: 224 pounds BMI: 39.68  Preferred Learning Style:   No preference indicated   Learning Readiness:   Change in progress  Handouts given during visit include:  Pre-Op Goals Bariatric Surgery Protein Shakes  During the appointment today the following Pre-Op Goals were reviewed with the patient: Maintain or lose weight as instructed by your surgeon Make healthy food choices Begin to limit portion sizes Limited concentrated sugars and fried foods Keep fat/sugar in the single digits per serving on  food labels Practice CHEWING your food  (aim for 30 chews per bite or until applesauce consistency) Practice not drinking 15 minutes before, during, and 30 minutes after each meal/snack Avoid all carbonated beverages  Avoid/limit caffeinated beverages  Avoid all sugar-sweetened beverages Consume 3 meals per day; eat every 3-5 hours Make a list of non-food related activities Aim for 64-100 ounces of FLUID daily  Aim for at least 60-80 grams of PROTEIN daily Look for a liquid protein source that contain ?15 g protein and ?5 g carbohydrate  (ex: shakes, drinks, shots)  Patient-Centered Goals: 10/10 specific/non-scale and confidence/importance scale  1-10  Demonstrated degree of understanding via:  Teach Back  Teaching Method Utilized:  Visual Auditory Hands on  Barriers to learning/adherence to lifestyle change: none identified  [facility] the [facility] to enroll in Pre-Op and Post-Op Nutrition Education when surgery date is scheduled.

## 2016-11-15 ENCOUNTER — Encounter: Payer: Self-pay | Admitting: Skilled Nursing Facility1

## 2016-11-15 ENCOUNTER — Encounter: Payer: 59 | Admitting: Skilled Nursing Facility1

## 2016-11-15 DIAGNOSIS — Z713 Dietary counseling and surveillance: Secondary | ICD-10-CM | POA: Diagnosis not present

## 2016-11-15 DIAGNOSIS — E6609 Other obesity due to excess calories: Secondary | ICD-10-CM

## 2016-11-15 NOTE — Progress Notes (Signed)
  Medical Nutrition Therapy:  Appt start time: 7:55   End time:  8:55  Patient is getting Roux-En-Y.  Pt returns having lost about 2 pounds. Pt states the weekends are the hardest due to lack of structure. Pt states everything is going well. Pt states she tries to be a little more active at home like walking and walking every day at work. Pt states she belongs to the [employer] support group and journals in preparing to be more mindfull. Pt states she is very excited to go through with her surgery to be relieved of her GERD symptoms.   Dietary Re-Call: Shake Meat and cheese and fruit or vegetables peanutbutter crackers Protein with veggies and potatoes   24 ounces of water by lunch and then before home and then at night another 24  Physical activity:getting 30 minutes 3 times a week of walking  Wt: 222.4 pounds BMI: 39.40  Preferred Learning Style:   No preference indicated   Learning Readiness:   Change in progress  Handouts given during visit include:  Pre-Op Goals Bariatric Surgery Protein Shakes  During the appointment today the following Pre-Op Goals were reviewed with the patient: Maintain or lose weight as instructed by your surgeon Make healthy food choices Begin to limit portion sizes Limited concentrated sugars and fried foods Keep fat/sugar in the single digits per serving on  food labels Practice CHEWING your food  (aim for 30 chews per bite or until applesauce consistency) Practice not drinking 15 minutes before, during, and 30 minutes after each meal/snack Avoid all carbonated beverages  Avoid/limit caffeinated beverages  Avoid all sugar-sweetened beverages Consume 3 meals per day; eat every 3-5 hours Make a list of non-food related activities Aim for 64-100 ounces of FLUID daily  Aim for at least 60-80 grams of PROTEIN daily Look for a liquid protein source that contain ?15 g protein and ?5 g carbohydrate  (ex: shakes, drinks, shots)  Patient-Centered  Goals: 10/10 specific/non-scale and confidence/importance scale 1-10  Demonstrated degree of understanding via:  Teach Back  Teaching Method Utilized:  Visual Auditory Hands on  Barriers to learning/adherence to lifestyle change: none identified  [facility] the [facility] to enroll in Pre-Op and Post-Op Nutrition Education when surgery date is scheduled.

## 2016-11-26 ENCOUNTER — Encounter (HOSPITAL_COMMUNITY): Payer: Self-pay

## 2016-11-29 ENCOUNTER — Other Ambulatory Visit: Payer: Self-pay | Admitting: Oncology

## 2016-12-06 ENCOUNTER — Ambulatory Visit: Payer: 59 | Admitting: Skilled Nursing Facility1

## 2016-12-06 ENCOUNTER — Encounter: Payer: Self-pay | Admitting: Skilled Nursing Facility1

## 2016-12-06 ENCOUNTER — Encounter: Payer: 59 | Attending: General Surgery | Admitting: Skilled Nursing Facility1

## 2016-12-06 DIAGNOSIS — E669 Obesity, unspecified: Secondary | ICD-10-CM

## 2016-12-06 DIAGNOSIS — Z713 Dietary counseling and surveillance: Secondary | ICD-10-CM | POA: Insufficient documentation

## 2016-12-06 NOTE — Progress Notes (Signed)
  Medical Nutrition Therapy:  Appt start time: 7:55   End time:  8:55  Patient is getting Roux-En-Y.  Pt returns having maintained wt. Pt states she has done a lot to Mentally prepare for surgery. Pt states Walking has been  hit or miss due to her dogs surgery. Pt state she struggles with the habit of Keeping drink away at meals. Pt states she has definitely been Getting her fluid in with her 24 ounce cup. Pt states she has had No reflux in 3 weeks!!  Dietary Re-Call: Shake Nuts for snack Meat and cheese and fruit or vegetables peanutbutter crackers Protein with veggies and potatoes   24 ounces of water by lunch and then before home and then at night another 24  Physical activity:getting 30 minutes 3 times a week of walking  Wt: 222.4 pounds BMI: 39.40  Preferred Learning Style:   No preference indicated   Learning Readiness:   Change in progress  Handouts given during visit include:  Pre-Op Goals Bariatric Surgery Protein Shakes  During the appointment today the following Pre-Op Goals were reviewed with the patient: Maintain or lose weight as instructed by your surgeon Make healthy food choices Begin to limit portion sizes Limited concentrated sugars and fried foods Keep fat/sugar in the single digits per serving on  food labels Practice CHEWING your food  (aim for 30 chews per bite or until applesauce consistency) Practice not drinking 15 minutes before, during, and 30 minutes after each meal/snack Avoid all carbonated beverages  Avoid/limit caffeinated beverages  Avoid all sugar-sweetened beverages Consume 3 meals per day; eat every 3-5 hours Make a list of non-food related activities Aim for 64-100 ounces of FLUID daily  Aim for at least 60-80 grams of PROTEIN daily Look for a liquid protein source that contain ?15 g protein and ?5 g carbohydrate  (ex: shakes, drinks, shots)  Patient-Centered Goals: 10/10 specific/non-scale and confidence/importance scale  1-10  Demonstrated degree of understanding via:  Teach Back  Teaching Method Utilized:  Visual Auditory Hands on  Barriers to learning/adherence to lifestyle change: none identified  [facility] to [facility] the [facility] to enroll in Pre-Op and Post-Op Nutrition Education when surgery date is scheduled.

## 2016-12-27 ENCOUNTER — Encounter: Payer: 59 | Attending: General Surgery | Admitting: Skilled Nursing Facility1

## 2016-12-27 ENCOUNTER — Encounter: Payer: Self-pay | Admitting: Skilled Nursing Facility1

## 2016-12-27 ENCOUNTER — Ambulatory Visit: Payer: 59 | Admitting: Skilled Nursing Facility1

## 2016-12-27 DIAGNOSIS — Z713 Dietary counseling and surveillance: Secondary | ICD-10-CM | POA: Insufficient documentation

## 2016-12-27 DIAGNOSIS — E669 Obesity, unspecified: Secondary | ICD-10-CM

## 2016-12-27 NOTE — Progress Notes (Signed)
  Medical Nutrition Therapy:  Appt start time: 7:55   End time:  8:55  Patient is getting Roux-En-Y.  Pt returns having lost 2 pounds. Pt states she has done a lot to Mentally prepare for surgery. Pt states Walking has been  hit or miss due to her dogs surgery. Pt state she struggles with the habit of Keeping drink away at meals. Pt states she has definitely been Getting her fluid in with her 24 ounce cup. Pt states she has had No reflux in 3 weeks!!  Pt states this her last [facility] appointment. Pt states she has been useing the stairs. Pt states not drinking with meals is still a challenge but she is working on it.   Dietary Re-Call: Shake Nuts for snack Meat and cheese and fruit or vegetables peanutbutter crackers Protein with veggies and potatoes   24 ounces of water by lunch and then before home and then at night another 24  Physical activity:getting 30 minutes 3 times a week of walking  Wt: 220.4 pounds BMI: 39.04  Preferred Learning Style:   No preference indicated   Learning Readiness:   Change in progress  Handouts given during visit include:  Pre-Op Goals Bariatric Surgery Protein Shakes  During the appointment today the following Pre-Op Goals were reviewed with the patient: Maintain or lose weight as instructed by your surgeon Make healthy food choices Begin to limit portion sizes Limited concentrated sugars and fried foods Keep fat/sugar in the single digits per serving on  food labels Practice CHEWING your food  (aim for 30 chews per bite or until applesauce consistency) Practice not drinking 15 minutes before, during, and 30 minutes after each meal/snack Avoid all carbonated beverages  Avoid/limit caffeinated beverages  Avoid all sugar-sweetened beverages Consume 3 meals per day; eat every 3-5 hours Make a list of non-food related activities Aim for 64-100 ounces of FLUID daily  Aim for at least 60-80 grams of PROTEIN daily Look for a liquid protein source  that contain ?15 g protein and ?5 g carbohydrate  (ex: shakes, drinks, shots)  Patient-Centered Goals: 10/10 specific/non-scale and confidence/importance scale 1-10  Demonstrated degree of understanding via:  Teach Back  Teaching Method Utilized:  Visual Auditory Hands on  Barriers to learning/adherence to lifestyle change: none identified  [facility] to call the [facility] to enroll in Pre-Op and Post-Op Nutrition Education when surgery date is scheduled.

## 2017-01-03 ENCOUNTER — Encounter: Payer: 59 | Admitting: Skilled Nursing Facility1

## 2017-01-03 DIAGNOSIS — Z713 Dietary counseling and surveillance: Secondary | ICD-10-CM | POA: Diagnosis not present

## 2017-01-03 DIAGNOSIS — E669 Obesity, unspecified: Secondary | ICD-10-CM

## 2017-01-04 ENCOUNTER — Encounter: Payer: Self-pay | Admitting: Skilled Nursing Facility1

## 2017-01-04 NOTE — Progress Notes (Signed)
  Pre-Operative Nutrition Class:  Appt start time: 1194   End time:  1830.  Patient was seen on 01/04/2017 for Pre-Operative Bariatric Surgery Education at the [facility].   Surgery date:  Surgery type: Sleeve Gastrectomy Start weight at [facility]: 224 Weight today: 223.5  TANITA  BODY COMP RESULTS  N/A   BMI (kg/m^2)    Fat Mass (lbs)    Fat Free Mass (lbs)    Total Body Water (lbs)    Samples given per MNT protocol. Patient educated on appropriate usage: Bariatric Advantage Multivitamin Lot # [id number] Exp: 6/19  Bariatric Advantage Calcium Citrate Lot # [id number] Exp: 03/25/17  Renee Pain Protein Shake Lot # [id number] Exp: 08/17/17  The following the learning objectives were met by the patient during this course:  Identify Pre-Op Dietary Goals and will begin 2 weeks pre-operatively  Identify appropriate sources of fluids and proteins   State protein recommendations and appropriate sources pre and post-operatively  Identify Post-Operative Dietary Goals and will follow for 2 weeks post-operatively  Identify appropriate multivitamin and calcium sources  Describe the need for physical activity post-operatively and will follow MD recommendations  State when to call healthcare provider regarding medication questions or post-operative complications  Handouts given during class include:  Pre-Op Bariatric Surgery Diet Handout  Protein Shake Handout  Post-Op Bariatric Surgery Nutrition Handout  [facility] Program Information Flyer  Support Group Information Flyer  [facility] Bariatric Supplements Price List  Follow-Up Plan: Patient will follow-up at [facility] 2 weeks post operatively for diet advancement per MD.

## 2017-01-13 ENCOUNTER — Ambulatory Visit: Payer: Self-pay | Admitting: General Surgery

## 2017-01-13 NOTE — H&P (Signed)
Jamie Ellis 01/13/2017 3:24 PM Location: [place] Office Patient #: [id number] DOB: [birth year] Married / Language: Jamie Ellis / Race: White Female  History of Present Illness Jamie Ellis; 01/13/2017 4:55 PM) Patient words: Pre-op RNY.  The patient is a 41 year old female who presents for a pre-op visit. She comes in today for her preoperative appointment. I initially met her last August. She has completed supervised weight loss. She denies any medical changes other than undergoing a prophylactic complete hysterectomy because of her underlying Lynch syndrome diagnosis. She denies any chest pain, chest pressure, shortness of breath, orthopnea, dyspnea on exertion. She denies any melena or hematochezia or hematuria. She denies any hematemesis. They did go up on her Lexapro dosage. She still has ongoing reflux which is unchanged. She states they changed her to Protonix. Her bariatric evaluation labs were unremarkable. Chest x-ray and EKG were unremarkable. Upper GI showed no motility issue but did show a small to moderate hiatal hernia.   Problem List/Past Medical Jamie Ellis M. Jamie Pulling, Jamie Ellis; 01/13/2017 5:02 PM) MORBID OBESITY (E66.01) HYPERTENSION, BENIGN (I10) HIATAL HERNIA WITH GERD (K21.9, K44.9) LYNCH SYNDROME (Z15.09) Jamie Ellis 6. We had a very long conversation today regarding her Lynch syndrome diagnosis. I gave her education material from NCCN guidelines as well as cancer.gov regarding Lynch syndrome and [place] 6. We discussed that no medical Society or national guidelines recommend routine endoscopic surveillance of the stomach or proximal small bowel. She was given information regarding the incidence of gastric and proximal small bowel cancer. Literature does range in reports of incidence. Recent evidence indicates incidence of gastric and small bowel malignancies generally are less than 3%. We discussed how Roux-en-Y gastric bypass would severely limit endoscopic surveillance especially  of the gastric remnant. We discussed that a highly specialized endoscopists could potentially reach the proximal small bowel. The patient states that she understands the risk but would like to proceed with her Roux-en-Y gastric bypass  Past Surgical History Jamie Ellis M. Jamie Pulling, Jamie Ellis; 01/13/2017 5:02 PM) Breast Augmentation Bilateral. Appendectomy Tonsillectomy Gallbladder Surgery - Laparoscopic  Diagnostic Studies History Jamie Ellis M. Jamie Pulling, Jamie Ellis; 01/13/2017 5:02 PM) Pap Smear 1-5 years ago Mammogram 1-3 years ago Colonoscopy never  Allergies Jamie Ellis, Jamie Ellis; 01/13/2017 3:26 PM) Penicillins  Medication History Jamie Ellis M. Jamie Pulling, Jamie Ellis; 01/13/2017 5:02 PM) Pantoprazole Sodium (40MG Tablet DR, Oral) Active. Citalopram Hydrobromide (40MG Tablet, Oral) Active. Metoprolol Succinate ER (100MG Tablet ER 24HR, Oral) Active. ProAir RespiClick (253 (90 Base)MCG/ACT Aero Pow Br Act, Inhalation) Active. Olopatadine HCl (0.6% Solution, Nasal) Active. Claritin (10MG Capsule, Oral) Active. Multiple Vitamin (Oral) Active. Nexplanon (68MG Implant, Subcutaneous) Active. Medications Reconciled OxyCODONE HCl (5MG/5ML Solution, 5-10 Milliliter Oral every four hours, as needed, Taken starting 01/13/2017) Active. Zofran ODT (4MG Tablet Disint, 1 (one) Tablet Oral every six hours, as needed, Taken starting 01/13/2017) Active. (As needed for nasuea)  Social History Jamie Ellis M. Jamie Pulling, Jamie Ellis; 01/13/2017 5:02 PM) No drug use Caffeine use Carbonated beverages, Coffee. Tobacco use Never smoker. Alcohol use Moderate alcohol use.  Family History Jamie Ellis M. Jamie Pulling, Jamie Ellis; 01/13/2017 5:02 PM) Respiratory Condition Son. Rectal Cancer Father. Hypertension Father. Depression Daughter. Heart disease in female family member before age 27  Pregnancy / Birth History Jamie Ellis. Jamie Pulling, Jamie Ellis; 01/13/2017 5:02 PM) Contraceptive History Contraceptive implant. Age at menarche 75 years. Gravida 2 Maternal age  66-25 Length (months) of breastfeeding 7-12 Para 2 Irregular periods  Other Problems Jamie Ellis M. Jamie Pulling, Jamie Ellis; 01/13/2017 5:02 PM) Asthma Chest pain Depression Gastroesophageal Reflux Disease Anxiety Disorder Alcohol Abuse Other  disease, cancer, significant illness Migraine Headache High blood pressure     Review of Systems Jamie Ellis; 01/13/2017 4:52 PM) General Not Present- Appetite Loss, Chills, Fatigue, Fever, Night Sweats, Weight Gain and Weight Loss. Skin Not Present- Change in Wart/Mole, Dryness, Hives, Jaundice, New Lesions, Non-Healing Wounds, Rash and Ulcer. HEENT Present- Seasonal Allergies. Not Present- Earache, Hearing Loss, Hoarseness, Nose Bleed, Oral Ulcers, Ringing in the Ears, Sinus Pain, Sore Throat, Visual Disturbances, Wears glasses/contact lenses and Yellow Eyes. Respiratory Present- Difficulty Breathing and Wheezing. Not Present- Bloody sputum, Chronic Cough and Snoring. Breast Not Present- Breast Mass, Breast Pain, Nipple Discharge and Skin Changes. Cardiovascular Not Present- Chest Pain, Difficulty Breathing Lying Down, Leg Cramps, Palpitations, Rapid Heart Rate, Shortness of Breath and Swelling of Extremities. Gastrointestinal Present- Nausea and Vomiting. Not Present- Abdominal Pain, Bloating, Bloody Stool, Change in Bowel Habits, Chronic diarrhea, Constipation, Difficulty Swallowing, Excessive gas, Gets full quickly at meals, Hemorrhoids, Indigestion and Rectal Pain. Female Genitourinary Not Present- Frequency, Nocturia, Painful Urination, Pelvic Pain and Urgency. Musculoskeletal Not Present- Back Pain, Joint Pain, Joint Stiffness, Muscle Pain, Muscle Weakness and Swelling of Extremities. Neurological Not Present- Decreased Memory, Fainting, Headaches, Numbness, Seizures, Tingling, Tremor, Trouble walking and Weakness. Psychiatric Not Present- Anxiety, Bipolar, Change in Sleep Pattern, Depression, Fearful and Frequent crying. Endocrine Not  Present- Cold Intolerance, Excessive Hunger, Hair Changes, Heat Intolerance, Hot flashes and New Diabetes. Hematology Present- Easy Bruising. Not Present- Blood Thinners, Excessive bleeding, Gland problems, HIV and Persistent Infections.  Vitals Jamie Areola M. Bedie Dominey Jamie Ellis; 01/13/2017 3:34 PM) 01/13/2017 3:25 PM Weight: 220.2 lb Height: 62in Body Surface Area: 1.99 m Body Mass Index: 40.27 kg/m  Temp.: 58F(Oral)  Pulse: 95 (Regular)  BP: 120/82 (Sitting, Left Arm, Standard)      Physical Exam Jamie Areola M. Tinika Bucknam Jamie Ellis; 01/13/2017 4:52 PM)  General Mental Status-Alert. General Appearance-Consistent with stated age. Hydration-Well hydrated. Voice-Normal. Note: obese  Head and Neck Head-normocephalic, atraumatic with no lesions or palpable masses. Trachea-midline. Thyroid Gland Characteristics - normal size and consistency.  Eye Eyeball - Bilateral-Extraocular movements intact. Sclera/Conjunctiva - Bilateral-No scleral icterus.  Chest and Lung Exam Chest and lung exam reveals -quiet, even and easy respiratory effort with no use of accessory muscles and on auscultation, normal breath sounds, no adventitious sounds and normal vocal resonance. Inspection Chest Wall - Normal. Back - normal.  Breast - Did not examine.  Cardiovascular Cardiovascular examination reveals -normal heart sounds, regular rate and rhythm with no murmurs and normal pedal pulses bilaterally.  Abdomen Inspection Inspection of the abdomen reveals - No Hernias. Skin - Scar - no surgical scars. Palpation/Percussion Palpation and Percussion of the abdomen reveal - Soft, Non Tender, No Rebound tenderness, No Rigidity (guarding) and No hepatosplenomegaly. Auscultation Auscultation of the abdomen reveals - Bowel sounds normal.  Peripheral Vascular Upper Extremity Palpation - Pulses bilaterally normal.  Neurologic Neurologic evaluation reveals -alert and oriented x 3 with no  impairment of recent or remote memory. Mental Status-Normal.  Neuropsychiatric The patient's mood and affect are described as -normal. Judgment and Insight-insight is appropriate concerning matters relevant to self.  Musculoskeletal Normal Exam - Left-Upper Extremity Strength Normal and Lower Extremity Strength Normal. Normal Exam - Right-Upper Extremity Strength Normal and Lower Extremity Strength Normal.  Lymphatic Head & Neck  General Head & Neck Lymphatics: Bilateral - Description - Normal. Axillary - Did not examine. Femoral & Inguinal - Did not examine.    Assessment & Plan Jamie Areola M. Jahkeem Kurka Jamie Ellis; 01/13/2017 4:59 PM)  MORBID OBESITY (E66.01) Impression:  We discussed the results of her workup. We discussed her labs and imaging studies. We discussed the steps of the surgical procedure again. We rediscussed the typical postoperative recovery course. We discussed the importance of preoperative melphalan. She was given her bowel prep instructions today. She was also given her postoperative prescriptions today. All of her questions were asked and answered. She was encouraged to call the office should she have any additional questions between now and surgery.  Current Plans Started OxyCODONE HCl 5MG/5ML, 5-10 Milliliter every four hours, as needed, 100 Milliliter, 01/13/2017, No Refill. Started Zofran ODT 4MG, 1 (one) Tablet every six hours, as needed, #20, 01/13/2017, No Refill. Local Order: As needed for nasuea Pt Education - EMW_preopbariatric [facility] SYNDROME (Z15.09) Story: [place] 6. We had a very long conversation today regarding her Lynch syndrome diagnosis. I gave her education material from NCCN guidelines as well as cancer.gov regarding Lynch syndrome and [place] 6. We discussed that no medical Society or national guidelines recommend routine endoscopic surveillance of the stomach or proximal small bowel. She was given information regarding the incidence of gastric and proximal  small bowel cancer. Literature does range in reports of incidence. Recent evidence indicates incidence of gastric and small bowel malignancies generally are less than 3%. We discussed how Roux-en-Y gastric bypass would severely limit endoscopic surveillance especially of the gastric remnant. We discussed that a highly specialized endoscopists could potentially reach the proximal small bowel. The patient states that she understands the risk but would like to proceed with her Roux-en-Y gastric bypass Impression: We again trediscussed her diagnosis of Lynch syndrome and the implications of it with having a gastric bypass making it very technically challenging and difficult to use surveilled that part of her foregut anatomy. We also rediscussed the potential incidents or development of foregut malignancies. She voiced understanding and wishes to proceed with Roux-en-Y gastric bypass surgery  HYPERTENSION, BENIGN (I10)  HIATAL HERNIA WITH GERD (K21.9) Impression: 41 year old female with hiatal hernia, and GERD failing maximal medical therapy. Patient also obese per BMI.  Again I think a sleeve gastrectomy with hiatal hernia repair could potentially worsen her reflux. We've previously discussed on several occasions the potential for worsening reflux which may require additional procedures. After much discussion over the past several months she prefers Roux-en-Y gastric bypass surgery  Jamie Ellis. Jamie Pulling, Jamie Ellis, FACS General, Bariatric, & Minimally Invasive Surgery [facility], [place]

## 2017-01-19 NOTE — Patient Instructions (Signed)
Jamie Ellis  01/19/2017   Your procedure is scheduled on: 01/24/17  Report to [facility] Main  Entrance take [place]  elevators to 3rd floor to  Short Stay Center at      [phone].  Call this number if you have problems the morning of surgery [phone]   Remember: ONLY 1 PERSON MAY GO WITH YOU TO SHORT STAY TO GET  READY MORNING OF YOUR SURGERY.  Do not eat food or drink liquids :After Midnight.     Take these medicines the morning of surgery with A SIP OF WATER: albuterol if needed and bring,   celexa,   protonix                                You may not have any metal on your body including hair pins and              piercings  Do not wear jewelry, make-up, lotions, powders or perfumes, deodorant             Do not wear nail polish.  Do not shave  48 hours prior to surgery.                 Do not bring valuables to the hospital. [facility] IS NOT             RESPONSIBLE   FOR VALUABLES.  Contacts, dentures or bridgework may not be worn into surgery.  Leave suitcase in the car. After surgery it may be brought to your room.                 Please read over the following fact sheets you were given: _____________________________________________________________________           [facility] - Preparing for Surgery Before surgery, you can play an important role.  Because skin is not sterile, your skin needs to be as free of germs as possible.  You can reduce the number of germs on your skin by washing with CHG (chlorahexidine gluconate) soap before surgery.  CHG is an antiseptic cleaner which kills germs and bonds with the skin to continue killing germs even after washing. Please DO NOT use if you have an allergy to CHG or antibacterial soaps.  If your skin becomes reddened/irritated stop using the CHG and inform your nurse when you arrive at Short Stay. Do not shave (including legs and underarms) for at least 48 hours prior to the first CHG shower.  You may shave  your face/neck. Please follow these instructions carefully:  1.  Shower with CHG Soap the night before surgery and the  morning of Surgery.  2.  If you choose to wash your hair, wash your hair first as usual with your  normal  shampoo.  3.  After you shampoo, rinse your hair and body thoroughly to remove the  shampoo.                           4.  Use CHG as you would any other liquid soap.  You can apply chg directly  to the skin and wash                       Gently with a scrungie or clean washcloth.  5.  Apply the CHG Soap to your body ONLY FROM THE NECK DOWN.   Do not use on face/ open                           Wound or open sores. Avoid contact with eyes, ears mouth and genitals (private parts).                       Wash face,  Genitals (private parts) with your normal soap.             6.  Wash thoroughly, paying special attention to the area where your surgery  will be performed.  7.  Thoroughly rinse your body with warm water from the neck down.  8.  DO NOT shower/wash with your normal soap after using and rinsing off  the CHG Soap.                9.  Pat yourself dry with a clean towel.            10.  Wear clean pajamas.            11.  Place clean sheets on your bed the night of your first shower and do not  sleep with pets. Day of Surgery : Do not apply any lotions/deodorants the morning of surgery.  Please wear clean clothes to the hospital/surgery center.  FAILURE TO FOLLOW THESE INSTRUCTIONS MAY RESULT IN THE CANCELLATION OF YOUR SURGERY PATIENT SIGNATURE_________________________________  NURSE SIGNATURE__________________________________  ________________________________________________________________________   Jamie Ellis  An incentive spirometer is a tool that can help keep your lungs clear and active. This tool measures how well you are filling your lungs with each breath. Taking long deep breaths may help reverse or decrease the chance of developing  breathing (pulmonary) problems (especially infection) following:  A long period of time when you are unable to move or be active. BEFORE THE PROCEDURE   If the spirometer includes an indicator to show your best effort, your nurse or respiratory therapist will set it to a desired goal.  If possible, sit up straight or lean slightly forward. Try not to slouch.  Hold the incentive spirometer in an upright position. INSTRUCTIONS FOR USE  1. Sit on the edge of your bed if possible, or sit up as far as you can in bed or on a chair. 2. Hold the incentive spirometer in an upright position. 3. Breathe out normally. 4. Place the mouthpiece in your mouth and seal your lips tightly around it. 5. Breathe in slowly and as deeply as possible, raising the piston or the ball toward the top of the column. 6. Hold your breath for 3-5 seconds or for as long as possible. Allow the piston or ball to fall to the bottom of the column. 7. Remove the mouthpiece from your mouth and breathe out normally. 8. Rest for a few seconds and repeat Steps 1 through 7 at least 10 times every 1-2 hours when you are awake. Take your time and take a few normal breaths between deep breaths. 9. The spirometer may include an indicator to show your best effort. Use the indicator as a goal to work toward during each repetition. 10. After each set of 10 deep breaths, practice coughing to be sure your lungs are clear. If you have an incision (the cut made at the time of surgery), support your incision when coughing by placing a pillow or  rolled up towels firmly against it. Once you are able to get out of bed, walk around indoors and cough well. You may stop using the incentive spirometer when instructed by your caregiver.  RISKS AND COMPLICATIONS  Take your time so you do not get dizzy or light-headed.  If you are in pain, you may need to take or ask for pain medication before doing incentive spirometry. It is harder to take a deep  breath if you are having pain. AFTER USE  Rest and breathe slowly and easily.  It can be helpful to keep track of a log of your progress. Your caregiver can provide you with a simple table to help with this. If you are using the spirometer at home, follow these instructions: [place] IF:   You are having difficultly using the spirometer.  You have trouble using the spirometer as often as instructed.  Your pain medication is not giving enough relief while using the spirometer.  You develop fever of 100.5 F (38.1 C) or higher. SEEK IMMEDIATE MEDICAL CARE IF:   You cough up bloody sputum that had not been present before.  You develop fever of 102 F (38.9 C) or greater.  You develop worsening pain at or near the incision site. MAKE SURE YOU:   Understand these instructions.  Will watch your condition.  Will get help right away if you are not doing well or get worse. Document Released: 02/14/2007 Document Revised: 12/27/2011 Document Reviewed: 04/17/2007 [facility] Patient Information 2014 [place], [place].   ________________________________________________________________________

## 2017-01-20 ENCOUNTER — Encounter (HOSPITAL_COMMUNITY)
Admission: RE | Admit: 2017-01-20 | Discharge: 2017-01-20 | Disposition: A | Discharge status: Home / Self Care | Payer: 59 | Source: Ambulatory Visit | Attending: General Surgery | Admitting: General Surgery

## 2017-01-20 ENCOUNTER — Encounter (HOSPITAL_COMMUNITY): Payer: Self-pay

## 2017-01-20 DIAGNOSIS — Z6839 Body mass index (BMI) 39.0-39.9, adult: Secondary | ICD-10-CM | POA: Diagnosis not present

## 2017-01-20 DIAGNOSIS — Z01812 Encounter for preprocedural laboratory examination: Secondary | ICD-10-CM | POA: Diagnosis not present

## 2017-01-20 DIAGNOSIS — K449 Diaphragmatic hernia without obstruction or gangrene: Secondary | ICD-10-CM | POA: Diagnosis not present

## 2017-01-20 HISTORY — DX: Other specified postprocedural states: Z98.890

## 2017-01-20 HISTORY — DX: Other specified postprocedural states: R11.2

## 2017-01-20 HISTORY — DX: Genetic susceptibility to other malignant neoplasm: Z15.09

## 2017-01-20 LAB — COMPREHENSIVE METABOLIC PANEL
ALT: 37 U/L (ref 14–54)
ANION GAP: 7 (ref 5–15)
AST: 38 U/L (ref 15–41)
Albumin: 4 g/dL (ref 3.5–5.0)
Alkaline Phosphatase: 63 U/L (ref 38–126)
BILIRUBIN TOTAL: 1.1 mg/dL (ref 0.3–1.2)
BUN: 16 mg/dL (ref 6–20)
CALCIUM: 9.1 mg/dL (ref 8.9–10.3)
CO2: 25 mmol/L (ref 22–32)
Chloride: 106 mmol/L (ref 101–111)
Creatinine, Ser: 0.77 mg/dL (ref 0.44–1.00)
GLUCOSE: 109 mg/dL — AB (ref 65–99)
Potassium: 4.6 mmol/L (ref 3.5–5.1)
Sodium: 138 mmol/L (ref 135–145)
TOTAL PROTEIN: 7.3 g/dL (ref 6.5–8.1)

## 2017-01-20 LAB — CBC WITH DIFFERENTIAL/PLATELET
BASOS ABS: 0 10*3/uL (ref 0.0–0.1)
BASOS PCT: 0 %
Eosinophils Absolute: 0.2 10*3/uL (ref 0.0–0.7)
Eosinophils Relative: 2 %
HCT: 36.9 % (ref 36.0–46.0)
Hemoglobin: 12.8 g/dL (ref 12.0–15.0)
Lymphocytes Relative: 14 %
Lymphs Abs: 1.9 10*3/uL (ref 0.7–4.0)
MCH: 31 pg (ref 26.0–34.0)
MCHC: 34.7 g/dL (ref 30.0–36.0)
MCV: 89.3 fL (ref 78.0–100.0)
MONO ABS: 0.5 10*3/uL (ref 0.1–1.0)
Monocytes Relative: 4 %
NEUTROS ABS: 11.1 10*3/uL — AB (ref 1.7–7.7)
Neutrophils Relative %: 80 %
PLATELETS: 286 10*3/uL (ref 150–400)
RBC: 4.13 MIL/uL (ref 3.87–5.11)
RDW: 13.7 % (ref 11.5–15.5)
WBC: 13.7 10*3/uL — ABNORMAL HIGH (ref 4.0–10.5)

## 2017-01-20 NOTE — Progress Notes (Signed)
CBC done 01/20/17 routed to Dr. Andrey Campanile via epic

## 2017-01-24 ENCOUNTER — Inpatient Hospital Stay (HOSPITAL_COMMUNITY): Payer: 59 | Admitting: Anesthesiology

## 2017-01-24 ENCOUNTER — Encounter (HOSPITAL_COMMUNITY): Payer: Self-pay | Admitting: *Deleted

## 2017-01-24 ENCOUNTER — Inpatient Hospital Stay (HOSPITAL_COMMUNITY)
Admission: RE | Admit: 2017-01-24 | Discharge: 2017-01-26 | DRG: 621 | Disposition: A | Discharge status: Home / Self Care | Payer: 59 | Source: Ambulatory Visit | Attending: General Surgery | Admitting: General Surgery

## 2017-01-24 ENCOUNTER — Encounter (HOSPITAL_COMMUNITY)
Admission: RE | Disposition: A | Discharge status: Home / Self Care | Payer: Self-pay | Source: Ambulatory Visit | Attending: General Surgery

## 2017-01-24 DIAGNOSIS — Z1509 Genetic susceptibility to other malignant neoplasm: Secondary | ICD-10-CM | POA: Diagnosis not present

## 2017-01-24 DIAGNOSIS — K219 Gastro-esophageal reflux disease without esophagitis: Secondary | ICD-10-CM | POA: Diagnosis present

## 2017-01-24 DIAGNOSIS — K449 Diaphragmatic hernia without obstruction or gangrene: Secondary | ICD-10-CM | POA: Diagnosis present

## 2017-01-24 DIAGNOSIS — I10 Essential (primary) hypertension: Secondary | ICD-10-CM | POA: Diagnosis present

## 2017-01-24 DIAGNOSIS — Z9884 Bariatric surgery status: Secondary | ICD-10-CM

## 2017-01-24 DIAGNOSIS — Z6839 Body mass index (BMI) 39.0-39.9, adult: Secondary | ICD-10-CM | POA: Diagnosis not present

## 2017-01-24 HISTORY — PX: LAPAROSCOPIC ROUX-EN-Y GASTRIC BYPASS WITH HIATAL HERNIA REPAIR: SHX6513

## 2017-01-24 LAB — HEMOGLOBIN AND HEMATOCRIT, BLOOD
HCT: 38.3 % (ref 36.0–46.0)
HEMOGLOBIN: 13.1 g/dL (ref 12.0–15.0)

## 2017-01-24 SURGERY — CREATION, GASTRIC BYPASS, LAPAROSCOPIC, USING ROUX-EN-Y GASTROENTEROSTOMY, WITH HIATAL HERNIA REPAIR
Anesthesia: General | Site: Abdomen

## 2017-01-24 MED ORDER — STERILE WATER FOR IRRIGATION IR SOLN
Status: DC | PRN
  Administered 2017-01-24: 2000 mL

## 2017-01-24 MED ORDER — OLOPATADINE HCL 0.6 % NA SOLN
1.0000 | Freq: Every day | NASAL | Status: DC | PRN
Start: 2017-01-24 — End: 2017-01-24

## 2017-01-24 MED ORDER — ONDANSETRON HCL 4 MG/2ML IJ SOLN
INTRAMUSCULAR | Status: DC | PRN
  Administered 2017-01-24: 4 mg via INTRAVENOUS

## 2017-01-24 MED ORDER — MIDAZOLAM HCL 2 MG/2ML IJ SOLN
INTRAMUSCULAR | Status: DC | PRN
  Administered 2017-01-24: 2 mg via INTRAVENOUS

## 2017-01-24 MED ORDER — ACETAMINOPHEN 160 MG/5ML PO SOLN: 325.0000 mg | ORAL | Status: DC | PRN

## 2017-01-24 MED ORDER — ROCURONIUM BROMIDE 50 MG/5ML IV SOSY
PREFILLED_SYRINGE | INTRAVENOUS | Status: AC
Start: 2017-01-24 — End: 2017-01-24
  Filled 2017-01-24: qty 5

## 2017-01-24 MED ORDER — LIDOCAINE 2% (20 MG/ML) 5 ML SYRINGE
INTRAMUSCULAR | Status: DC | PRN
  Administered 2017-01-24: 100 mg via INTRAVENOUS

## 2017-01-24 MED ORDER — MIDAZOLAM HCL 2 MG/2ML IJ SOLN
INTRAMUSCULAR | Status: AC
  Filled 2017-01-24: qty 2

## 2017-01-24 MED ORDER — ROCURONIUM BROMIDE 50 MG/5ML IV SOSY
PREFILLED_SYRINGE | INTRAVENOUS | Status: AC
  Filled 2017-01-24: qty 5

## 2017-01-24 MED ORDER — HEPARIN SODIUM (PORCINE) 5000 UNIT/ML IJ SOLN
5000.0000 [IU] | INTRAMUSCULAR | Status: AC
Start: 2017-01-24 — End: 2017-01-24
  Administered 2017-01-24: 5000 [IU] via SUBCUTANEOUS
  Filled 2017-01-24: qty 1

## 2017-01-24 MED ORDER — MORPHINE SULFATE (PF) 2 MG/ML IV SOLN
1.0000 mg | INTRAVENOUS | Status: DC | PRN
  Administered 2017-01-24: 1 mg via INTRAVENOUS
  Administered 2017-01-25: 2 mg via INTRAVENOUS
  Filled 2017-01-24 (×2): qty 1

## 2017-01-24 MED ORDER — HYDROMORPHONE HCL 1 MG/ML IJ SOLN
0.2500 mg | INTRAMUSCULAR | Status: DC | PRN
  Administered 2017-01-24 (×3): 0.5 mg via INTRAVENOUS

## 2017-01-24 MED ORDER — EVICEL 5 ML EX KIT
PACK | Freq: Once | CUTANEOUS | Status: AC
  Administered 2017-01-24: 10 mL
  Filled 2017-01-24: qty 1

## 2017-01-24 MED ORDER — ENOXAPARIN SODIUM 30 MG/0.3ML ~~LOC~~ SOLN
30.0000 mg | Freq: Two times a day (BID) | SUBCUTANEOUS | Status: DC
  Administered 2017-01-25 – 2017-01-26 (×2): 30 mg via SUBCUTANEOUS
  Filled 2017-01-24 (×2): qty 0.3

## 2017-01-24 MED ORDER — APREPITANT 40 MG PO CAPS
40.0000 mg | ORAL_CAPSULE | ORAL | Status: AC
  Administered 2017-01-24: 40 mg via ORAL
  Filled 2017-01-24: qty 1

## 2017-01-24 MED ORDER — ALBUTEROL SULFATE HFA 108 (90 BASE) MCG/ACT IN AERS: 2.0000 | INHALATION_SPRAY | Freq: Four times a day (QID) | RESPIRATORY_TRACT | Status: DC | PRN

## 2017-01-24 MED ORDER — PROMETHAZINE HCL 25 MG/ML IJ SOLN
12.5000 mg | Freq: Four times a day (QID) | INTRAMUSCULAR | Status: DC | PRN
  Administered 2017-01-25 – 2017-01-26 (×2): 12.5 mg via INTRAVENOUS
  Filled 2017-01-24 (×2): qty 1

## 2017-01-24 MED ORDER — SUCCINYLCHOLINE CHLORIDE 200 MG/10ML IV SOSY
PREFILLED_SYRINGE | INTRAVENOUS | Status: DC | PRN
  Administered 2017-01-24: 140 mg via INTRAVENOUS

## 2017-01-24 MED ORDER — BUPIVACAINE LIPOSOME 1.3 % IJ SUSP
20.0000 mL | Freq: Once | INTRAMUSCULAR | Status: AC
  Administered 2017-01-24: 20 mL
  Filled 2017-01-24: qty 20

## 2017-01-24 MED ORDER — 0.9 % SODIUM CHLORIDE (POUR BTL) OPTIME
TOPICAL | Status: DC | PRN
  Administered 2017-01-24: 1000 mL

## 2017-01-24 MED ORDER — SUCCINYLCHOLINE CHLORIDE 200 MG/10ML IV SOSY
PREFILLED_SYRINGE | INTRAVENOUS | Status: AC
  Filled 2017-01-24: qty 10

## 2017-01-24 MED ORDER — ACETAMINOPHEN 500 MG PO TABS
1000.0000 mg | ORAL_TABLET | ORAL | Status: AC
  Administered 2017-01-24: 1000 mg via ORAL
  Filled 2017-01-24: qty 2

## 2017-01-24 MED ORDER — ROCURONIUM BROMIDE 10 MG/ML (PF) SYRINGE
PREFILLED_SYRINGE | INTRAVENOUS | Status: DC | PRN
  Administered 2017-01-24: 50 mg via INTRAVENOUS
  Administered 2017-01-24 (×2): 20 mg via INTRAVENOUS
  Administered 2017-01-24 (×2): 10 mg via INTRAVENOUS

## 2017-01-24 MED ORDER — ONDANSETRON HCL 4 MG/2ML IJ SOLN
INTRAMUSCULAR | Status: AC
  Filled 2017-01-24: qty 2

## 2017-01-24 MED ORDER — SCOPOLAMINE 1 MG/3DAYS TD PT72
1.0000 | MEDICATED_PATCH | TRANSDERMAL | Status: DC
  Administered 2017-01-24: 1.5 mg via TRANSDERMAL
  Filled 2017-01-24: qty 1

## 2017-01-24 MED ORDER — DEXAMETHASONE SODIUM PHOSPHATE 10 MG/ML IJ SOLN
4.0000 mg | INTRAMUSCULAR | Status: AC
  Administered 2017-01-24: 4 mg via INTRAVENOUS

## 2017-01-24 MED ORDER — ALBUTEROL SULFATE (2.5 MG/3ML) 0.083% IN NEBU
2.5000 mg | INHALATION_SOLUTION | Freq: Four times a day (QID) | RESPIRATORY_TRACT | Status: DC | PRN
Start: 2017-01-24 — End: 2017-01-26

## 2017-01-24 MED ORDER — GABAPENTIN 300 MG PO CAPS
300.0000 mg | ORAL_CAPSULE | ORAL | Status: AC
  Administered 2017-01-24: 300 mg via ORAL
  Filled 2017-01-24: qty 1

## 2017-01-24 MED ORDER — PREMIER PROTEIN SHAKE
2.0000 [oz_av] | ORAL | Status: DC
  Administered 2017-01-26 (×4): 2 [oz_av] via ORAL

## 2017-01-24 MED ORDER — LEVOFLOXACIN IN D5W 750 MG/150ML IV SOLN
750.0000 mg | INTRAVENOUS | Status: AC
  Administered 2017-01-24: 750 mg via INTRAVENOUS

## 2017-01-24 MED ORDER — ONDANSETRON HCL 4 MG/2ML IJ SOLN
4.0000 mg | Freq: Four times a day (QID) | INTRAMUSCULAR | Status: DC | PRN
  Administered 2017-01-24 – 2017-01-26 (×5): 4 mg via INTRAVENOUS
  Filled 2017-01-24 (×5): qty 2

## 2017-01-24 MED ORDER — SUFENTANIL CITRATE 50 MCG/ML IV SOLN
INTRAVENOUS | Status: DC | PRN
  Administered 2017-01-24 (×2): 10 ug via INTRAVENOUS
  Administered 2017-01-24: 20 ug via INTRAVENOUS
  Administered 2017-01-24: 10 ug via INTRAVENOUS

## 2017-01-24 MED ORDER — LEVOFLOXACIN IN D5W 750 MG/150ML IV SOLN
INTRAVENOUS | Status: AC
  Filled 2017-01-24: qty 150

## 2017-01-24 MED ORDER — SODIUM CHLORIDE 0.9 % IJ SOLN
INTRAMUSCULAR | Status: DC | PRN
  Administered 2017-01-24: 50 mL

## 2017-01-24 MED ORDER — PROMETHAZINE HCL 25 MG/ML IJ SOLN
6.2500 mg | INTRAMUSCULAR | Status: DC | PRN
  Administered 2017-01-24: 6.25 mg via INTRAVENOUS

## 2017-01-24 MED ORDER — DEXAMETHASONE SODIUM PHOSPHATE 10 MG/ML IJ SOLN
INTRAMUSCULAR | Status: AC
  Filled 2017-01-24: qty 1

## 2017-01-24 MED ORDER — KCL IN DEXTROSE-NACL 20-5-0.45 MEQ/L-%-% IV SOLN
INTRAVENOUS | Status: DC
  Administered 2017-01-24: 15:00:00 via INTRAVENOUS
  Administered 2017-01-24: 1000 mL via INTRAVENOUS
  Administered 2017-01-25: 15:00:00 via INTRAVENOUS
  Administered 2017-01-25: 1000 mL via INTRAVENOUS
  Administered 2017-01-26 (×2): via INTRAVENOUS
  Filled 2017-01-24 (×7): qty 1000

## 2017-01-24 MED ORDER — EVICEL 5 ML EX KIT
PACK | Freq: Once | CUTANEOUS | Status: DC
  Filled 2017-01-24: qty 1

## 2017-01-24 MED ORDER — EPHEDRINE SULFATE-NACL 50-0.9 MG/10ML-% IV SOSY
PREFILLED_SYRINGE | INTRAVENOUS | Status: DC | PRN
  Administered 2017-01-24 (×3): 10 mg via INTRAVENOUS

## 2017-01-24 MED ORDER — ACETAMINOPHEN 325 MG PO TABS: 650.0000 mg | ORAL_TABLET | ORAL | Status: DC | PRN

## 2017-01-24 MED ORDER — SUGAMMADEX SODIUM 200 MG/2ML IV SOLN
INTRAVENOUS | Status: DC | PRN
  Administered 2017-01-24: 200 mg via INTRAVENOUS

## 2017-01-24 MED ORDER — SODIUM CHLORIDE 0.9 % IJ SOLN
INTRAMUSCULAR | Status: AC
  Filled 2017-01-24: qty 50

## 2017-01-24 MED ORDER — METOPROLOL TARTRATE 5 MG/5ML IV SOLN
5.0000 mg | Freq: Four times a day (QID) | INTRAVENOUS | Status: DC
  Administered 2017-01-24 – 2017-01-26 (×8): 5 mg via INTRAVENOUS
  Filled 2017-01-24 (×8): qty 5

## 2017-01-24 MED ORDER — LACTATED RINGERS IV SOLN
INTRAVENOUS | Status: DC | PRN
  Administered 2017-01-24 (×2): via INTRAVENOUS

## 2017-01-24 MED ORDER — HYDROMORPHONE HCL 1 MG/ML IJ SOLN
INTRAMUSCULAR | Status: AC
  Filled 2017-01-24: qty 2

## 2017-01-24 MED ORDER — PROPOFOL 10 MG/ML IV BOLUS
INTRAVENOUS | Status: DC | PRN
Start: 2017-01-24 — End: 2017-01-24
  Administered 2017-01-24: 170 mg via INTRAVENOUS

## 2017-01-24 MED ORDER — LACTATED RINGERS IR SOLN
Status: DC | PRN
  Administered 2017-01-24: 1000 mL

## 2017-01-24 MED ORDER — PROPOFOL 10 MG/ML IV BOLUS
INTRAVENOUS | Status: AC
  Filled 2017-01-24: qty 20

## 2017-01-24 MED ORDER — OLOPATADINE HCL 0.1 % OP SOLN: 1.0000 [drp] | Freq: Every day | OPHTHALMIC | Status: DC | PRN

## 2017-01-24 MED ORDER — DIPHENHYDRAMINE HCL 50 MG/ML IJ SOLN: 12.5000 mg | Freq: Three times a day (TID) | INTRAMUSCULAR | Status: DC | PRN

## 2017-01-24 MED ORDER — SUGAMMADEX SODIUM 200 MG/2ML IV SOLN
INTRAVENOUS | Status: AC
  Filled 2017-01-24: qty 2

## 2017-01-24 MED ORDER — PANTOPRAZOLE SODIUM 40 MG IV SOLR
40.0000 mg | Freq: Every day | INTRAVENOUS | Status: DC
  Administered 2017-01-24 – 2017-01-25 (×2): 40 mg via INTRAVENOUS
  Filled 2017-01-24 (×2): qty 40

## 2017-01-24 MED ORDER — SUFENTANIL CITRATE 50 MCG/ML IV SOLN
INTRAVENOUS | Status: AC
  Filled 2017-01-24: qty 1

## 2017-01-24 MED ORDER — PROMETHAZINE HCL 25 MG/ML IJ SOLN
INTRAMUSCULAR | Status: AC
  Filled 2017-01-24: qty 1

## 2017-01-24 MED ORDER — OXYCODONE HCL 5 MG/5ML PO SOLN
5.0000 mg | ORAL | Status: DC | PRN
  Administered 2017-01-25 (×4): 5 mg via ORAL
  Filled 2017-01-24 (×4): qty 5

## 2017-01-24 SURGICAL SUPPLY — 68 items
APPLICATOR COTTON TIP 6IN STRL (MISCELLANEOUS) ×6 IMPLANT
BLADE SURG SZ11 CARB STEEL (BLADE) ×3 IMPLANT
CABLE HIGH FREQUENCY MONO STRZ (ELECTRODE) ×3 IMPLANT
CHLORAPREP W/TINT 26ML (MISCELLANEOUS) ×6 IMPLANT
CLIP SUT LAPRA TY ABSORB (SUTURE) ×6 IMPLANT
COVER SURGICAL LIGHT HANDLE (MISCELLANEOUS) ×3 IMPLANT
CUTTER FLEX LINEAR 45M (STAPLE) IMPLANT
DERMABOND ADVANCED (GAUZE/BANDAGES/DRESSINGS)
DERMABOND ADVANCED .7 DNX12 (GAUZE/BANDAGES/DRESSINGS) IMPLANT
DEVICE SUT QUICK LOAD TK 5 (STAPLE) ×10 IMPLANT
DEVICE SUT TI-KNOT TK 5X26 (MISCELLANEOUS) ×2 IMPLANT
DEVICE SUTURE ENDOST 10MM (ENDOMECHANICALS) ×3 IMPLANT
DEVICE TI KNOT TK5 (MISCELLANEOUS) ×1
DRAIN PENROSE 18X1/2 LTX STRL (DRAIN) ×3 IMPLANT
DRAIN PENROSE 18X1/4 LTX STRL (WOUND CARE) ×3 IMPLANT
ELECT REM PT RETURN 15FT ADLT (MISCELLANEOUS) ×3 IMPLANT
GAUZE SPONGE 4X4 12PLY STRL (GAUZE/BANDAGES/DRESSINGS) IMPLANT
GAUZE SPONGE 4X4 16PLY XRAY LF (GAUZE/BANDAGES/DRESSINGS) ×3 IMPLANT
GLOVE BIOGEL M STRL SZ7.5 (GLOVE) IMPLANT
GOWN STRL REUS W/TWL XL LVL3 (GOWN DISPOSABLE) ×18 IMPLANT
HOVERMATT SINGLE USE (MISCELLANEOUS) ×3 IMPLANT
IRRIG SUCT STRYKERFLOW 2 WTIP (MISCELLANEOUS) ×3
IRRIGATION SUCT STRKRFLW 2 WTP (MISCELLANEOUS) ×1 IMPLANT
KIT BASIN OR (CUSTOM PROCEDURE TRAY) ×3 IMPLANT
KIT GASTRIC LAVAGE 34FR ADT (SET/KITS/TRAYS/PACK) ×3 IMPLANT
LUBRICANT JELLY K Y 4OZ (MISCELLANEOUS) ×3 IMPLANT
MARKER SKIN DUAL TIP RULER LAB (MISCELLANEOUS) ×3 IMPLANT
NEEDLE SPNL 22GX3.5 QUINCKE BK (NEEDLE) ×3 IMPLANT
PACK CARDIOVASCULAR III (CUSTOM PROCEDURE TRAY) ×3 IMPLANT
QUICK LOAD TK 5 (STAPLE) ×5
RELOAD 45 VASCULAR/THIN (ENDOMECHANICALS) IMPLANT
RELOAD ENDO STITCH 2.0 (ENDOMECHANICALS) ×18
RELOAD STAPLE TA45 3.5 REG BLU (ENDOMECHANICALS) IMPLANT
RELOAD STAPLER BLUE 60MM (STAPLE) ×4 IMPLANT
RELOAD STAPLER GOLD 60MM (STAPLE) ×1 IMPLANT
RELOAD STAPLER WHITE 60MM (STAPLE) ×2 IMPLANT
SCISSORS LAP 5X45 EPIX DISP (ENDOMECHANICALS) ×3 IMPLANT
SET IRRIG TUBING LAPAROSCOPIC (IRRIGATION / IRRIGATOR) ×3 IMPLANT
SHEARS HARMONIC ACE PLUS 45CM (MISCELLANEOUS) ×3 IMPLANT
SLEEVE ADV FIXATION 12X100MM (TROCAR) ×6 IMPLANT
SLEEVE XCEL OPT CAN 5 100 (ENDOMECHANICALS) IMPLANT
SOLUTION ANTI FOG 6CC (MISCELLANEOUS) ×3 IMPLANT
STAPLER ECHELON BIOABSB 60 FLE (MISCELLANEOUS) IMPLANT
STAPLER ECHELON LONG 60 440 (INSTRUMENTS) ×3 IMPLANT
STAPLER RELOAD BLUE 60MM (STAPLE) ×12
STAPLER RELOAD GOLD 60MM (STAPLE) ×3
STAPLER RELOAD WHITE 60MM (STAPLE) ×6
SUT MNCRL AB 4-0 PS2 18 (SUTURE) ×9 IMPLANT
SUT RELOAD ENDO STITCH 2 48X1 (ENDOMECHANICALS) ×4
SUT RELOAD ENDO STITCH 2.0 (ENDOMECHANICALS) ×5
SUT SURGIDAC NAB ES-9 0 48 120 (SUTURE) ×15 IMPLANT
SUT VIC AB 2-0 SH 27 (SUTURE) ×2
SUT VIC AB 2-0 SH 27X BRD (SUTURE) ×1 IMPLANT
SUTURE RELOAD END STTCH 2 48X1 (ENDOMECHANICALS) ×4 IMPLANT
SUTURE RELOAD ENDO STITCH 2.0 (ENDOMECHANICALS) ×5 IMPLANT
SYR 10ML ECCENTRIC (SYRINGE) ×3 IMPLANT
SYR 20CC LL (SYRINGE) ×6 IMPLANT
TIP RIGID 35CM EVICEL (HEMOSTASIS) ×3 IMPLANT
TOWEL OR 17X26 10 PK STRL BLUE (TOWEL DISPOSABLE) ×3 IMPLANT
TOWEL OR NON WOVEN STRL DISP B (DISPOSABLE) ×3 IMPLANT
TROCAR ADV FIXATION 12X100MM (TROCAR) ×3 IMPLANT
TROCAR ADV FIXATION 5X100MM (TROCAR) ×3 IMPLANT
TROCAR BLADELESS OPT 5 100 (ENDOMECHANICALS) ×3 IMPLANT
TROCAR XCEL 12X100 BLDLESS (ENDOMECHANICALS) ×3 IMPLANT
TUBING CONNECTING 10 (TUBING) ×4 IMPLANT
TUBING CONNECTING 10' (TUBING) ×2
TUBING ENDO SMARTCAP PENTAX (MISCELLANEOUS) ×3 IMPLANT
TUBING INSUF HEATED (TUBING) ×3 IMPLANT

## 2017-01-24 NOTE — Interval H&P Note (Signed)
History and Physical Interval Note:  01/24/2017 7:12 AM  Jamie Ellis  has presented today for surgery, with the diagnosis of Morbid obesity, HTN, Lynch Syndrome, Hiatal Hernia  The various methods of treatment have been discussed with the patient and family. After consideration of risks, benefits and other options for treatment, the patient has consented to  Procedure(s): LAPAROSCOPIC ROUX-EN-Y GASTRIC BYPASS WITH HIATAL HERNIA REPAIR, UPPER ENDO (N/A) as a surgical intervention .  The patient's history has been reviewed, patient examined, no change in status, stable for surgery.  I have reviewed the patient's chart and labs.  Questions were answered to the patient's satisfaction.    Mary Sella. Andrey Campanile, MD, FACS General, Bariatric, & Minimally Invasive Surgery [facility], [place]  [facility]

## 2017-01-24 NOTE — Discharge Instructions (Signed)

## 2017-01-24 NOTE — Anesthesia Postprocedure Evaluation (Addendum)
Anesthesia Post Note  Patient: Carrell Palmatier  Procedure(s) Performed: Procedure(s) (LRB): LAPAROSCOPIC ROUX-EN-Y GASTRIC BYPASS WITH HIATAL HERNIA REPAIR, UPPER ENDO (N/A)  Patient location during evaluation: PACU Anesthesia Type: General Level of consciousness: awake and alert Pain management: pain level controlled Vital Signs Assessment: post-procedure vital signs reviewed and stable Respiratory status: spontaneous breathing, nonlabored ventilation, respiratory function stable and patient connected to nasal cannula oxygen Cardiovascular status: blood pressure returned to baseline and stable Postop Assessment: no signs of nausea or vomiting Anesthetic complications: no       Last Vitals:  Vitals:   01/24/17 1145 01/24/17 1200  BP: 134/88 (!) 142/90  Pulse: 74 70  Resp: 13 11  Temp: 36.9 C 36.9 C    Last Pain:  Vitals:   01/24/17 1200  TempSrc:   PainSc: 3                  Anayely Constantine S

## 2017-01-24 NOTE — H&P (View-Only) (Signed)
Jamie Ellis 01/13/2017 3:24 PM Location: [place] Office Patient #: [id number] DOB: [birth year] Married / Language: Cleophus Ellis / Race: White Female  History of Present Illness [name] [name] MD; 01/13/2017 4:55 PM) Patient words: Pre-op RNY.  The patient is a 41 year old female who presents for a pre-op visit. She comes in today for her preoperative appointment. I initially met her last August. She has completed supervised weight loss. She denies any medical changes other than undergoing a prophylactic complete hysterectomy because of her underlying Lynch syndrome diagnosis. She denies any chest pain, chest pressure, shortness of breath, orthopnea, dyspnea on exertion. She denies any melena or hematochezia or hematuria. She denies any hematemesis. They did go up on her Lexapro dosage. She still has ongoing reflux which is unchanged. She states they changed her to Protonix. Her bariatric evaluation labs were unremarkable. Chest x-ray and EKG were unremarkable. Upper GI showed no motility issue but did show a small to moderate hiatal hernia.   Problem List/Past Medical [name] M. [name], MD; 01/13/2017 5:02 PM) MORBID OBESITY (E66.01) HYPERTENSION, BENIGN (I10) HIATAL HERNIA WITH GERD (K21.9, K44.9) LYNCH SYNDROME (Z15.09) [place] 6. We had a very long conversation today regarding her Lynch syndrome diagnosis. I gave her education material from NCCN guidelines as well as cancer.gov regarding Lynch syndrome and [place] 6. We discussed that no medical Society or national guidelines recommend routine endoscopic surveillance of the stomach or proximal small bowel. She was given information regarding the incidence of gastric and proximal small bowel cancer. Literature does range in reports of incidence. Recent evidence indicates incidence of gastric and small bowel malignancies generally are less than 3%. We discussed how Roux-en-Y gastric bypass would severely limit endoscopic surveillance especially  of the gastric remnant. We discussed that a highly specialized endoscopists could potentially reach the proximal small bowel. The patient states that she understands the risk but would like to proceed with her Roux-en-Y gastric bypass  Past Surgical History [name] M. [name], MD; 01/13/2017 5:02 PM) Breast Augmentation Bilateral. Appendectomy Tonsillectomy Gallbladder Surgery - Laparoscopic  Diagnostic Studies History [name] M. [name], MD; 01/13/2017 5:02 PM) Pap Smear 1-5 years ago Mammogram 1-3 years ago Colonoscopy never  Allergies Malachi Bonds, CMA; 01/13/2017 3:26 PM) Penicillins  Medication History [name] M. [name], MD; 01/13/2017 5:02 PM) Pantoprazole Sodium (40MG Tablet DR, Oral) Active. Citalopram Hydrobromide (40MG Tablet, Oral) Active. Metoprolol Succinate ER (100MG Tablet ER 24HR, Oral) Active. ProAir RespiClick (253 (90 Base)MCG/ACT Aero Pow Br Act, Inhalation) Active. Olopatadine HCl (0.6% Solution, Nasal) Active. Claritin (10MG Capsule, Oral) Active. Multiple Vitamin (Oral) Active. Nexplanon (68MG Implant, Subcutaneous) Active. Medications Reconciled OxyCODONE HCl (5MG/5ML Solution, 5-10 Milliliter Oral every four hours, as needed, Taken starting 01/13/2017) Active. Zofran ODT (4MG Tablet Disint, 1 (one) Tablet Oral every six hours, as needed, Taken starting 01/13/2017) Active. (As needed for nasuea)  Social History [name] M. [name], MD; 01/13/2017 5:02 PM) No drug use Caffeine use Carbonated beverages, Coffee. Tobacco use Never smoker. Alcohol use Moderate alcohol use.  Family History [name] M. [name], MD; 01/13/2017 5:02 PM) Respiratory Condition Son. Rectal Cancer Father. Hypertension Father. Depression Daughter. Heart disease in female family member before age 27  Pregnancy / Birth History Jamie Ellis. [name], MD; 01/13/2017 5:02 PM) Contraceptive History Contraceptive implant. Age at menarche 75 years. Gravida 2 Maternal age  66-25 Length (months) of breastfeeding 7-12 Para 2 Irregular periods  Other Problems [name] M. [name], MD; 01/13/2017 5:02 PM) Asthma Chest pain Depression Gastroesophageal Reflux Disease Anxiety Disorder Alcohol Abuse Other  disease, cancer, significant illness Migraine Headache High blood pressure     Review of Systems [name] M. [name] MD; 01/13/2017 4:52 PM) General Not Present- Appetite Loss, Chills, Fatigue, Fever, Night Sweats, Weight Gain and Weight Loss. Skin Not Present- Change in Wart/Mole, Dryness, Hives, Jaundice, New Lesions, Non-Healing Wounds, Rash and Ulcer. HEENT Present- Seasonal Allergies. Not Present- Earache, Hearing Loss, Hoarseness, Nose Bleed, Oral Ulcers, Ringing in the Ears, Sinus Pain, Sore Throat, Visual Disturbances, Wears glasses/contact lenses and Yellow Eyes. Respiratory Present- Difficulty Breathing and Wheezing. Not Present- Bloody sputum, Chronic Cough and Snoring. Breast Not Present- Breast Mass, Breast Pain, Nipple Discharge and Skin Changes. Cardiovascular Not Present- Chest Pain, Difficulty Breathing Lying Down, Leg Cramps, Palpitations, Rapid Heart Rate, Shortness of Breath and Swelling of Extremities. Gastrointestinal Present- Nausea and Vomiting. Not Present- Abdominal Pain, Bloating, Bloody Stool, Change in Bowel Habits, Chronic diarrhea, Constipation, Difficulty Swallowing, Excessive gas, Gets full quickly at meals, Hemorrhoids, Indigestion and Rectal Pain. Female Genitourinary Not Present- Frequency, Nocturia, Painful Urination, Pelvic Pain and Urgency. Musculoskeletal Not Present- Back Pain, Joint Pain, Joint Stiffness, Muscle Pain, Muscle Weakness and Swelling of Extremities. Neurological Not Present- Decreased Memory, Fainting, Headaches, Numbness, Seizures, Tingling, Tremor, Trouble walking and Weakness. Psychiatric Not Present- Anxiety, Bipolar, Change in Sleep Pattern, Depression, Fearful and Frequent crying. Endocrine Not  Present- Cold Intolerance, Excessive Hunger, Hair Changes, Heat Intolerance, Hot flashes and New Diabetes. Hematology Present- Easy Bruising. Not Present- Blood Thinners, Excessive bleeding, Gland problems, HIV and Persistent Infections.  Vitals [name] M. Solimar Maiden MD; 01/13/2017 3:34 PM) 01/13/2017 3:25 PM Weight: 220.2 lb Height: 62in Body Surface Area: 1.99 m Body Mass Index: 40.27 kg/m  Temp.: 79F(Oral)  Pulse: 95 (Regular)  BP: 120/82 (Sitting, Left Arm, Standard)      Physical Exam [name] M. Ricci Dirocco MD; 01/13/2017 4:52 PM)  General Mental Status-Alert. General Appearance-Consistent with stated age. Hydration-Well hydrated. Voice-Normal. Note: obese  Head and Neck Head-normocephalic, atraumatic with no lesions or palpable masses. Trachea-midline. Thyroid Gland Characteristics - normal size and consistency.  Eye Eyeball - Bilateral-Extraocular movements intact. Sclera/Conjunctiva - Bilateral-No scleral icterus.  Chest and Lung Exam Chest and lung exam reveals -quiet, even and easy respiratory effort with no use of accessory muscles and on auscultation, normal breath sounds, no adventitious sounds and normal vocal resonance. Inspection Chest Wall - Normal. Back - normal.  Breast - Did not examine.  Cardiovascular Cardiovascular examination reveals -normal heart sounds, regular rate and rhythm with no murmurs and normal pedal pulses bilaterally.  Abdomen Inspection Inspection of the abdomen reveals - No Hernias. Skin - Scar - no surgical scars. Palpation/Percussion Palpation and Percussion of the abdomen reveal - Soft, Non Tender, No Rebound tenderness, No Rigidity (guarding) and No hepatosplenomegaly. Auscultation Auscultation of the abdomen reveals - Bowel sounds normal.  Peripheral Vascular Upper Extremity Palpation - Pulses bilaterally normal.  Neurologic Neurologic evaluation reveals -alert and oriented x 3 with no  impairment of recent or remote memory. Mental Status-Normal.  Neuropsychiatric The patient's mood and affect are described as -normal. Judgment and Insight-insight is appropriate concerning matters relevant to self.  Musculoskeletal Normal Exam - Left-Upper Extremity Strength Normal and Lower Extremity Strength Normal. Normal Exam - Right-Upper Extremity Strength Normal and Lower Extremity Strength Normal.  Lymphatic Head & Neck  General Head & Neck Lymphatics: Bilateral - Description - Normal. Axillary - Did not examine. Femoral & Inguinal - Did not examine.    Assessment & Plan [name] M. Shamekia Tippets MD; 01/13/2017 4:59 PM)  MORBID OBESITY (E66.01) Impression:  We discussed the results of her workup. We discussed her labs and imaging studies. We discussed the steps of the surgical procedure again. We rediscussed the typical postoperative recovery course. We discussed the importance of preoperative melphalan. She was given her bowel prep instructions today. She was also given her postoperative prescriptions today. All of her questions were asked and answered. She was encouraged to call the office should she have any additional questions between now and surgery.  Current Plans Started OxyCODONE HCl 5MG/5ML, 5-10 Milliliter every four hours, as needed, 100 Milliliter, 01/13/2017, No Refill. Started Zofran ODT 4MG, 1 (one) Tablet every six hours, as needed, #20, 01/13/2017, No Refill. Local Order: As needed for nasuea Pt Education - EMW_preopbariatric [facility] SYNDROME (Z15.09) Story: [place] 6. We had a very long conversation today regarding her Lynch syndrome diagnosis. I gave her education material from NCCN guidelines as well as cancer.gov regarding Lynch syndrome and [place] 6. We discussed that no medical Society or national guidelines recommend routine endoscopic surveillance of the stomach or proximal small bowel. She was given information regarding the incidence of gastric and proximal  small bowel cancer. Literature does range in reports of incidence. Recent evidence indicates incidence of gastric and small bowel malignancies generally are less than 3%. We discussed how Roux-en-Y gastric bypass would severely limit endoscopic surveillance especially of the gastric remnant. We discussed that a highly specialized endoscopists could potentially reach the proximal small bowel. The patient states that she understands the risk but would like to proceed with her Roux-en-Y gastric bypass Impression: We again trediscussed her diagnosis of Lynch syndrome and the implications of it with having a gastric bypass making it very technically challenging and difficult to use surveilled that part of her foregut anatomy. We also rediscussed the potential incidents or development of foregut malignancies. She voiced understanding and wishes to proceed with Roux-en-Y gastric bypass surgery  HYPERTENSION, BENIGN (I10)  HIATAL HERNIA WITH GERD (K21.9) Impression: 41 year old female with hiatal hernia, and GERD failing maximal medical therapy. Patient also obese per BMI.  Again I think a sleeve gastrectomy with hiatal hernia repair could potentially worsen her reflux. We've previously discussed on several occasions the potential for worsening reflux which may require additional procedures. After much discussion over the past several months she prefers Roux-en-Y gastric bypass surgery  Jamie Ellis. [name], MD, FACS General, Bariatric, & Minimally Invasive Surgery [facility], [place]

## 2017-01-24 NOTE — Anesthesia Procedure Notes (Signed)
Procedure Name: Intubation Date/Time: 01/24/2017 7:24 AM Performed by: Leroy Libman L Patient Re-evaluated:Patient Re-evaluated prior to inductionOxygen Delivery Method: Circle system utilized Preoxygenation: Pre-oxygenation with 100% oxygen Intubation Type: IV induction Ventilation: Mask ventilation without difficulty Laryngoscope Size: Miller and 2 Grade View: Grade I Tube type: Oral Tube size: 7.5 mm Number of attempts: 1 Airway Equipment and Method: Stylet Placement Confirmation: ETT inserted through vocal cords under direct vision,  positive ETCO2 and breath sounds checked- equal and bilateral Secured at: 22 cm Tube secured with: Tape Dental Injury: Teeth and Oropharynx as per pre-operative assessment

## 2017-01-24 NOTE — Op Note (Signed)
Larena Ohnemus [id number] [birth year]. 01/24/2017  Preoperative diagnosis:  1. Morbid obesity (BMI 39)  2. Hypertension 3. Hiatal Hernia 4. GERD  Postoperative  diagnosis:  1. same  Surgical procedure: Laparoscopic Roux-en-Y gastric bypass (ante-colic, ante-gastric); Laparoscopic hiatal hernia repair; upper endoscopy  Surgeon: Atilano Ina, M.D. FACS  Asst.: Feliciana Rossetti, MD; Domingo Dimes Plocki PA-S  Anesthesia: General plus Exparel  Complications: None   EBL: Minimal   Drains: None   Disposition: PACU in good condition   Indications for procedure: 41yo female with morbid obesity who has been unsuccessful at sustained weight loss. The patient's comorbidities are listed above. We discussed the risk and benefits of surgery including but not limited to anesthesia risk, bleeding, infection, blood clot formation, anastomotic leak, anastomotic stricture, ulcer formation, death, respiratory complications, intestinal blockage, internal hernia, gallstone formation, vitamin and nutritional deficiencies, injury to surrounding structures, inability to access gastric remnant and proximal duodenum for endoscopic surveillance, failure to lose weight and mood changes.   Description of procedure: Patient is brought to the operating room and general anesthesia induced. The patient had received preoperative broad-spectrum IV antibiotics and subcutaneous heparin. The abdomen was widely sterilely prepped with Chloraprep and draped. Patient timeout was performed and correct patient and procedure confirmed. Access was obtained with a 12 mm Optiview trocar in the left upper quadrant and pneumoperitoneum established without difficulty. Under direct vision 12 mm trocars were placed laterally in the right upper quadrant, right upper quadrant midclavicular line, and to the left and above the umbilicus for the camera port. A 5 mm trocar was placed laterally in the left upper quadrant.  Exparel was infiltrated in bilateral  upper abdominal walls as a TAPP block.   I decided to inspect the hiatal hernia first. She had a moderate hiatal hernia. There was a significant gap between the left and right crura. I decided to repair the hiatal hernia first. The right crus was identified and the hernia sac was reduced using blunt dissection along with harmonic scalpel. Dissection continued anteriorly across the top of the hiatus into the left side. The hernia sac around the left crura was incised and bluntly swept away from surrounding contents using a laparoscopic Kitner. We were able to get a retrogastric window and a Penrose drain was placed around the proximal stomach contained with retraction. I continue to mobilize and reduce the stomach from the hiatus. This was done again with blunt dissection along with harmonic scalpel ensuring that there is no hot blade around the esophagus. The aorta was identified. The parietal pleura was not violated. We were able to reduce the stomach as well as get around 2 cm of esophagus into the abdominal cavity. At this point I felt that I reduce the hiatal hernia well enough. I then reapproximated the left and right crura with interrupted 0 Ethibond sutures using titanium tie knots. The left and right crura were reapproximated with 5 interrupted sutures. The esophagus had been stented with a calibration tube. 10 mL of air was infiltrated into the balloon of the calibration tube and gently pulled back. There is adequate resistence at the GE junction within the abdominal cavity. The calibration tube was deflated and withdrawn from the abdominal cavity. At this point we turned our attention to the intestinal part of the case.  The omentum was brought into the upper abdomen and the transverse mesocolon elevated and the ligament of Treitz clearly identified. A 45 cm biliopancreatic limb was then carefully measured from the ligament of Treitz. The small  intestine was divided at this point with a single firing  of the white load linear stapler. A Penrose drain was sutured to the end of the Roux-en-Y limb for later identification. A 100 cm Roux-en-Y limb was then carefully measured. At this point a side-to-side anastomosis was created between the Roux limb and the end of the biliopancreatic limb. This was accomplished with a single firing of the 60 mm white load linear stapler. The common enterotomy was closed with a running 2-0 Vicryl begun at either end of the enterotomy and tied centrally. Eviceal tissue sealant was placed over the anastomosis. The mesenteric defect was then closed with running 2-0 silk. The omentum was then divided with the harmonic scalpel up towards the transverse colon to allow mobility of the Roux limb toward the gastric pouch. The patient was then placed in steep reversed Trendelenburg. Through a 5 mm subxiphoid site the [facility] retractor was placed and the left lobe of the liver elevated with excellent exposure of the upper stomach and hiatus. The angle of Hiss was then mobilized with the harmonic scalpel. A 4-5 cm gastric pouch was then carefully measured along the lesser curve of the stomach. Dissection was carried along the lesser curve at this point with the Harmonic scalpel working carefully back toward the lesser sac at right angles to the lesser curve. The free lesser sac was then entered. After being sure all tubes were removed from the stomach an initial firing of the gold load 60 mm linear stapler was fired at right angles across the lesser curve for about 4 cm. The gastric pouch was further mobilized posteriorly and then the pouch was completed with 3 further firings of the 60 mm blue load linear stapler  up through the previously dissected angle of His. It was ensured that the pouch was completely mobilized away from the gastric remnant. This created a nice tubular 4-5 cm gastric pouch. The Roux limb was then brought up in an antecolic fashion with the candycane facing to the  patient's left without undue tension. The gastrojejunostomy was created with an initial posterior row of 2-0 Vicryl between the Roux limb and the staple line of the gastric pouch. Enterotomies were then made in the gastric pouch and the Roux limb with the harmonic scalpel and at approximately 2-2-1/2 cm anastomosis was created with a single firing of the 45mm blue load linear stapler. The staple line was inspected and was intact without bleeding. The common enterotomy was then closed with running 2-0 Vicryl begun at either end and tied centrally. The Ewall tube was then easily passed through the anastomosis and an outer anterior layer of running 2-0 Vicryl was placed. The Ewald tube was removed. With the outlet of the gastrojejunostomy clamped and under saline irrigation the assistant performed upper endoscopy and with the gastric pouch tensely distended with air-there was no evidence of leak on this test. The pouch was desufflated. The Vonita Moss defect was closed with running 2-0 silk. The abdomen was inspected for any evidence of bleeding or bowel injury and everything looked fine. The Nathanson retractor was removed under direct vision after coating the anastomosis with Tisseel tissue sealant. All CO2 was evacuated and trochars removed. Skin incisions were closed with 4-0 monocryl in a subcuticular fashion followed by Benzoin, steri-strips, and bandages. Sponge needle and instrument counts were correct. The patient was taken to the PACU in good condition.    Mary Sella. Andrey Campanile, MD, FACS General, Bariatric, & Minimally Invasive Surgery [facility], [place]

## 2017-01-24 NOTE — Op Note (Signed)
Preoperative diagnosis: Roux-en-Y gastric bypass  Postoperative diagnosis: Same   Procedure: Upper endoscopy   Surgeon: Feliciana Rossetti, M.D.  Anesthesia: Gen.   Indications for procedure: This patient was undergoing a Roux-en-Y gastric bypass.   Description of procedure: The endoscopy was placed in the mouth and into the oropharynx and under endoscopic vision it was advanced to the esophagogastric junction. The pouch was insufflated and no bleeding or bubbles were seen. The GEJ was identified at 40cm from the teeth. No bleeding or leaks were detected. The scope was withdrawn without difficulty.   Feliciana Rossetti, M.D. General, Bariatric, & Minimally Invasive Surgery [facility] [place]

## 2017-01-24 NOTE — Anesthesia Preprocedure Evaluation (Signed)
Anesthesia Evaluation  Patient identified by MRN, date of birth, ID band Patient awake    Reviewed: Allergy & Precautions, NPO status , Patient's Chart, lab work & pertinent test results  History of Anesthesia Complications (+) PONV  Airway Mallampati: II  TM Distance: <3 FB Neck ROM: Full    Dental no notable dental hx.    Pulmonary asthma ,    Pulmonary exam normal breath sounds clear to auscultation       Cardiovascular hypertension, Normal cardiovascular exam Rhythm:Regular Rate:Normal     Neuro/Psych negative neurological ROS  negative psych ROS   GI/Hepatic negative GI ROS, Neg liver ROS,   Endo/Other  Morbid obesity  Renal/GU negative Renal ROS  negative genitourinary   Musculoskeletal negative musculoskeletal ROS (+)   Abdominal   Peds negative pediatric ROS (+)  Hematology negative hematology ROS (+)   Anesthesia Other Findings   Reproductive/Obstetrics negative OB ROS                             Anesthesia Physical Anesthesia Plan  ASA: III  Anesthesia Plan: General   Post-op Pain Management:    Induction: Intravenous  Airway Management Planned: Oral ETT  Additional Equipment:   Intra-op Plan:   Post-operative Plan: Extubation in OR  Informed Consent: I have reviewed the patients History and Physical, chart, labs and discussed the procedure including the risks, benefits and alternatives for the proposed anesthesia with the patient or authorized representative who has indicated his/her understanding and acceptance.   Dental advisory given  Plan Discussed with: CRNA and Surgeon  Anesthesia Plan Comments:         Anesthesia Quick Evaluation

## 2017-01-24 NOTE — Transfer of Care (Signed)
Immediate Anesthesia Transfer of Care Note  Patient: Maleta Pacha  Procedure(s) Performed: Procedure(s): LAPAROSCOPIC ROUX-EN-Y GASTRIC BYPASS WITH HIATAL HERNIA REPAIR, UPPER ENDO (N/A)  Patient Location: PACU  Anesthesia Type:General  Level of Consciousness: sedated  Airway & Oxygen Therapy: spontaneous breathing face mask 100%  Post-op Assessment: Report given to RN and Post -op Vital signs reviewed and stable  Post vital signs: Reviewed and stable  Last Vitals:  Vitals:   01/24/17 0521  BP: 135/88  Pulse: 92  Resp: 18  Temp: 36.8 C    Last Pain:  Vitals:   01/24/17 0541  TempSrc:   PainSc: 0-No pain      Patients Stated Pain Goal: 3 (01/24/17 0541)  Complications: No apparent anesthesia complications

## 2017-01-25 ENCOUNTER — Encounter (HOSPITAL_COMMUNITY): Payer: Self-pay | Admitting: General Surgery

## 2017-01-25 LAB — COMPREHENSIVE METABOLIC PANEL
ALT: 162 U/L — ABNORMAL HIGH (ref 14–54)
ANION GAP: 5 (ref 5–15)
AST: 188 U/L — ABNORMAL HIGH (ref 15–41)
Albumin: 3.8 g/dL (ref 3.5–5.0)
Alkaline Phosphatase: 56 U/L (ref 38–126)
BILIRUBIN TOTAL: 1.1 mg/dL (ref 0.3–1.2)
BUN: 7 mg/dL (ref 6–20)
CO2: 26 mmol/L (ref 22–32)
CREATININE: 0.69 mg/dL (ref 0.44–1.00)
Calcium: 9 mg/dL (ref 8.9–10.3)
Chloride: 107 mmol/L (ref 101–111)
GFR calc non Af Amer: >60 mL/min (ref >60)
GLUCOSE: 137 mg/dL — AB (ref 65–99)
Potassium: 4 mmol/L (ref 3.5–5.1)
SODIUM: 138 mmol/L (ref 135–145)
Total Protein: 7.3 g/dL (ref 6.5–8.1)

## 2017-01-25 LAB — CBC WITH DIFFERENTIAL/PLATELET
Basophils Absolute: 0 10*3/uL (ref 0.0–0.1)
Basophils Relative: 0 %
EOS ABS: 0 10*3/uL (ref 0.0–0.7)
Eosinophils Relative: 0 %
HCT: 34.9 % — ABNORMAL LOW (ref 36.0–46.0)
HEMOGLOBIN: 11.8 g/dL — AB (ref 12.0–15.0)
LYMPHS ABS: 2.1 10*3/uL (ref 0.7–4.0)
LYMPHS PCT: 18 %
MCH: 30.7 pg (ref 26.0–34.0)
MCHC: 33.8 g/dL (ref 30.0–36.0)
MCV: 90.9 fL (ref 78.0–100.0)
MONOS PCT: 9 %
Monocytes Absolute: 1 10*3/uL (ref 0.1–1.0)
NEUTROS ABS: 8.5 10*3/uL — AB (ref 1.7–7.7)
NEUTROS PCT: 73 %
Platelets: 284 10*3/uL (ref 150–400)
RBC: 3.84 MIL/uL — AB (ref 3.87–5.11)
RDW: 14.2 % (ref 11.5–15.5)
WBC: 11.6 10*3/uL — AB (ref 4.0–10.5)

## 2017-01-25 MED ORDER — LIP MEDEX EX OINT
TOPICAL_OINTMENT | CUTANEOUS | Status: AC
  Administered 2017-01-25: 1
  Filled 2017-01-25: qty 7

## 2017-01-25 MED ORDER — SIMETHICONE 40 MG/0.6ML PO SUSP
40.0000 mg | Freq: Four times a day (QID) | ORAL | Status: DC | PRN
  Administered 2017-01-25: 40 mg via ORAL
  Filled 2017-01-25 (×2): qty 0.6

## 2017-01-25 NOTE — Progress Notes (Signed)
Patient alert and oriented, Post op day 1.  Provided support and encouragement.  Encouraged pulmonary toilet, ambulation and small sips of liquids.  Protein shake started. All questions answered.  Will continue to monitor. 

## 2017-01-25 NOTE — Plan of Care (Signed)
Problem: Food- and Nutrition-Related Knowledge Deficit (NB-1.1) Goal: Nutrition education Formal process to instruct or train a patient/client in a skill or to impart knowledge to help patients/clients voluntarily manage or modify food choices and eating behavior to maintain or improve health. Outcome: Completed/Met Date Met: 01/25/17 Nutrition Education Note  Received consult for diet education per DROP protocol.   Discussed 2 week post op diet with pt. Emphasized that liquids must be non carbonated, non caffeinated, and sugar free. Fluid goals discussed. Pt to follow up with outpatient bariatric RD for further diet progression after 2 weeks. Multivitamins and minerals also reviewed. Teach back method used, pt expressed understanding, expect good compliance.   Diet: First 2 Weeks  You will see the nutritionist about two (2) weeks after your surgery. The nutritionist will increase the types of foods you can eat if you are handling liquids well:  If you have severe vomiting or nausea and cannot handle clear liquids lasting longer than 1 day, call your surgeon  Protein Shake  Drink at least 2 ounces of shake 5-6 times per day  Each serving of protein shakes (usually 8 - 12 ounces) should have a minimum of:  15 grams of protein  And no more than 5 grams of carbohydrate  Goal for protein each day:  Men = 80 grams per day  Women = 60 grams per day  Protein powder may be added to fluids such as non-fat milk or Lactaid milk or Soy milk (limit to 35 grams added protein powder per serving)   Hydration  Slowly increase the amount of water and other clear liquids as tolerated (See Acceptable Fluids)  Slowly increase the amount of protein shake as tolerated  Sip fluids slowly and throughout the day  May use sugar substitutes in small amounts (no more than 6 - 8 packets per day; i.e. Splenda)   Fluid Goal  The first goal is to drink at least 8 ounces of protein shake/drink per day (or as directed  by the nutritionist); some examples of protein shakes are Premier Protein, Johnson [employer] Johnson, AMR Corporation, EAS Edge HP, and Unjury. See handout from pre-op Bariatric Education Class:  Slowly increase the amount of protein shake you drink as tolerated  You may find it easier to slowly sip shakes throughout the day  It is important to get your proteins in first  Your fluid goal is to drink 64 - 100 ounces of fluid daily  It may take a few weeks to build up to this  32 oz (or more) should be clear liquids  And  32 oz (or more) should be full liquids (see below for examples)  Liquids should not contain sugar, caffeine, or carbonation   Clear Liquids:  Water or Sugar-free flavored water (i.e. Fruit H2O, Propel)  Decaffeinated coffee or tea (sugar-free)  Crystal Lite, Wyler's Lite, Minute Maid Lite  Sugar-free Jell-O  Bouillon or broth  Sugar-free Popsicle: *Less than 20 calories each; Limit 1 per day   Full Liquids:  Protein Shakes/Drinks + 2 choices per day of other full liquids  Full liquids must be:  No More Than 12 grams of Carbs per serving  No More Than 3 grams of Fat per serving  Strained low-fat cream soup  Non-Fat milk  Fat-free Lactaid Milk  Sugar-free yogurt (Dannon Lite & Fit, [place] yogurt, Oikos Zero)   Clayton Bibles, MS, RD, LDN Pager: [phone] After Hours Pager: [phone]

## 2017-01-25 NOTE — Progress Notes (Signed)
Patient ID: Jamie Ellis, female   DOB: [birth year], 41 y.o.   MRN: [id number]  Progress Note: Metabolic and Bariatric Surgery Service   Subjective: Walked several times. Been drinking water/ice last night/this am. Gotten in about 12oz total. Sometimes feel like it sits in upper chest for a sec. Some epigastric cramps with PO.   Objective: Vital signs in last 24 hours: Temp:  [98.2 F (36.8 C)-99.4 F (37.4 C)] 98.6 F (37 C) (04/10 0518) Pulse Rate:  [70-101] 78 (04/10 0518) Resp:  [10-18] 17 (04/10 0518) BP: (116-147)/(71-96) 116/71 (04/10 0518) SpO2:  [92 %-100 %] 92 % (04/10 0518) Weight:  [99.8 kg (220 lb)] 99.8 kg (220 lb) (04/10 0607)    Intake/Output from previous day: 04/09 0701 - 04/10 0700 In: 3581.7 [P.O.:540; I.V.:3041.7] Out: 2395 [Urine:2345; Blood:50] Intake/Output this shift: No intake/output data recorded.  Lungs: cta  Cardiovascular: reg  Abd: soft, mild approp TTP, incisions c/d/i  Extremities: no edema, no rash  Neuro: alert, nonfocal, resting comfortably  Lab Results: CBC   Recent Labs  01/24/17 1128 01/25/17 0509  WBC  --  11.6*  HGB 13.1 11.8*  HCT 38.3 34.9*  PLT  --  284   BMET  Recent Labs  01/25/17 0509  NA 138  K 4.0  CL 107  CO2 26  GLUCOSE 137*  BUN 7  CREATININE 0.69  CALCIUM 9.0   PT/INR No results for input(s): LABPROT, INR in the last 72 hours. ABG No results for input(s): PHART, HCO3 in the last 72 hours.  Invalid input(s): PCO2, PO2  Studies/Results:  Anti-infectives: Anti-infectives    Start     Dose/Rate Route Frequency Ordered Stop   01/24/17 0624  levofloxacin (LEVAQUIN) 750 MG/150ML IVPB    Comments:  Leroy Libman   : cabinet override      01/24/17 0624 01/24/17 0730   01/24/17 0514  levofloxacin (LEVAQUIN) IVPB 750 mg     750 mg 100 mL/hr over 90 Minutes Intravenous On call to O.R. 01/24/17 0514 01/24/17 0800      Medications: Scheduled Meds: . enoxaparin (LOVENOX) injection  30 mg  Subcutaneous Q12H  . metoprolol  5 mg Intravenous Q6H  . pantoprazole (PROTONIX) IV  40 mg Intravenous QHS  . [START ON 01/26/2017] protein supplement shake  2 oz Oral Q2H  . scopolamine  1 patch Transdermal On Call to OR   Continuous Infusions: . dextrose 5 % and 0.45 % NaCl with KCl 20 mEq/L 1,000 mL (01/25/17 0655)   PRN Meds:.oxyCODONE **AND** acetaminophen, acetaminophen, albuterol, diphenhydrAMINE, morphine injection, olopatadine, ondansetron (ZOFRAN) IV, promethazine  Assessment/Plan: Patient Active Problem List   Diagnosis Date Noted  . Morbid obesity (HCC) 01/24/2017  . Hiatal hernia with GERD 01/24/2017  . Essential hypertension 01/24/2017  . S/P gastric bypass 01/24/2017  . Lynch syndrome 05/06/2016  . Genetic testing 05/06/2016  . Family history of Lynch syndrome   . Family history of rectal cancer    s/p Procedure(s): LAPAROSCOPIC ROUX-EN-Y GASTRIC BYPASS WITH HIATAL HERNIA REPAIR, UPPER ENDO 01/24/2017  Doing well. Has expected epigastric cramps. No fever. Wbc ok Cont oral diet Ambulate,  Chemical vte prophylaxis  Disposition:  LOS: 1 day  The patient will be in the hospital for normal postop protocol  Atilano Ina, MD [phone] [facility]

## 2017-01-25 NOTE — Progress Notes (Signed)
Patient alert and oriented, pain is controlled. Patient is tolerating fluids, advanced to protein shake today, patient is tolerating well. Reviewed Gastric Bypass discharge instructions with patient and patient is able to articulate understanding. Provided information on BELT program, Support Group and [facility] outpatient pharmacy. All questions answered, will continue to monitor.    

## 2017-01-26 LAB — CBC WITH DIFFERENTIAL/PLATELET
BASOS PCT: 0 %
Basophils Absolute: 0 10*3/uL (ref 0.0–0.1)
EOS ABS: 0.5 10*3/uL (ref 0.0–0.7)
EOS PCT: 5 %
HCT: 36.3 % (ref 36.0–46.0)
HEMOGLOBIN: 12.1 g/dL (ref 12.0–15.0)
LYMPHS ABS: 2 10*3/uL (ref 0.7–4.0)
Lymphocytes Relative: 20 %
MCH: 30.9 pg (ref 26.0–34.0)
MCHC: 33.3 g/dL (ref 30.0–36.0)
MCV: 92.6 fL (ref 78.0–100.0)
Monocytes Absolute: 0.7 10*3/uL (ref 0.1–1.0)
Monocytes Relative: 7 %
NEUTROS PCT: 68 %
Neutro Abs: 6.7 10*3/uL (ref 1.7–7.7)
PLATELETS: 283 10*3/uL (ref 150–400)
RBC: 3.92 MIL/uL (ref 3.87–5.11)
RDW: 14.3 % (ref 11.5–15.5)
WBC: 10 10*3/uL (ref 4.0–10.5)

## 2017-01-26 LAB — COMPREHENSIVE METABOLIC PANEL
ALBUMIN: 3.6 g/dL (ref 3.5–5.0)
ALK PHOS: 54 U/L (ref 38–126)
ALT: 348 U/L — AB (ref 14–54)
ANION GAP: 8 (ref 5–15)
AST: 320 U/L — ABNORMAL HIGH (ref 15–41)
BUN: 7 mg/dL (ref 6–20)
CHLORIDE: 105 mmol/L (ref 101–111)
CO2: 25 mmol/L (ref 22–32)
CREATININE: 0.73 mg/dL (ref 0.44–1.00)
Calcium: 8.9 mg/dL (ref 8.9–10.3)
GFR calc non Af Amer: >60 mL/min (ref >60)
Glucose, Bld: 130 mg/dL — ABNORMAL HIGH (ref 65–99)
Potassium: 4 mmol/L (ref 3.5–5.1)
Sodium: 138 mmol/L (ref 135–145)
Total Bilirubin: 0.8 mg/dL (ref 0.3–1.2)
Total Protein: 7.2 g/dL (ref 6.5–8.1)

## 2017-01-26 MED ORDER — OXYCODONE HCL 5 MG/5ML PO SOLN: 5.0000 mg | ORAL | 0 refills | Status: AC | PRN

## 2017-01-26 MED ORDER — METOPROLOL TARTRATE 50 MG PO TABS: 50.0000 mg | ORAL_TABLET | Freq: Two times a day (BID) | ORAL | 1 refills | Status: AC

## 2017-01-26 NOTE — Progress Notes (Signed)
Patient alert and oriented, Post op day 2.  Provided support and encouragement.  Encouraged pulmonary toilet, ambulation and small sips of liquids. Discussed change in patient Metoprolol from extended release to  50 mg doses twice a day that was called into patient pharmacy.  Patient states understanding of medication change through teachback.  Work note given to patient.  Review of discharge information provided yesterday.  All questions answered.  Will continue to monitor.

## 2017-01-26 NOTE — Progress Notes (Signed)
Pt and family were given discharge instructions and all questions were answered. Patient was taken to main entrance by NT.

## 2017-01-26 NOTE — Progress Notes (Signed)
2 Days Post-Op  Subjective: Doing ok. Water going fine. Ambulating a lot. Shakes went well yesterday. Shake this am sitting a little heavy. Not much pain  Objective: Vital signs in last 24 hours: Temp:  [98.6 F (37 C)-99.5 F (37.5 C)] 98.6 F (37 C) (04/11 0518) Pulse Rate:  [84-98] 88 (04/11 0518) Resp:  [15-18] 16 (04/11 0518) BP: (114-138)/(72-91) 114/72 (04/11 0518) SpO2:  [92 %-97 %] 93 % (04/11 0518) Weight:  [99.5 kg (219 lb 6.4 oz)] 99.5 kg (219 lb 6.4 oz) (04/11 0518) Last BM Date: 01/24/17  Intake/Output from previous day: 04/10 0701 - 04/11 0700 In: 1860 [P.O.:360; I.V.:1500] Out: 4725 [Urine:4725] Intake/Output this shift: No intake/output data recorded.  Resting comfortably. Not ill appearing cta b/l Reg Soft, obese, incisions c/d/I No edema  Lab Results:   Recent Labs  01/25/17 0509 01/26/17 0455  WBC 11.6* 10.0  HGB 11.8* 12.1  HCT 34.9* 36.3  PLT 284 283   BMET  Recent Labs  01/25/17 0509 01/26/17 0455  NA 138 138  K 4.0 4.0  CL 107 105  CO2 26 25  GLUCOSE 137* 130*  BUN 7 7  CREATININE 0.69 0.73  CALCIUM 9.0 8.9   PT/INR No results for input(s): LABPROT, INR in the last 72 hours. ABG No results for input(s): PHART, HCO3 in the last 72 hours.  Invalid input(s): PCO2, PO2  Studies/Results: No results found.  Anti-infectives: Anti-infectives    Start     Dose/Rate Route Frequency Ordered Stop   01/24/17 0624  levofloxacin (LEVAQUIN) 750 MG/150ML IVPB    Comments:  Leroy Libman   : cabinet override      01/24/17 0624 01/24/17 0730   01/24/17 0514  levofloxacin (LEVAQUIN) IVPB 750 mg     750 mg 100 mL/hr over 90 Minutes Intravenous On call to O.R. 01/24/17 0514 01/24/17 0800      Assessment/Plan: s/p Procedure(s): LAPAROSCOPIC ROUX-EN-Y GASTRIC BYPASS WITH HIATAL HERNIA REPAIR, UPPER ENDO (N/A) Principal Problem:   Morbid obesity (HCC) Active Problems:   Hiatal hernia with GERD   Essential hypertension   S/P gastric  bypass  Doing well. No fever, tachycardia, wbc.  Anticiptate dc later this am Cont dc teaching Ambulate Will recheck LFTs as outpt   Mary Sella. Andrey Campanile, MD, FACS General, Bariatric, & Minimally Invasive Surgery [facility], [place]   LOS: 2 days    Jamie Ellis 01/26/2017

## 2017-01-31 NOTE — Discharge Summary (Signed)
Physician Discharge Summary  Jamie Ellis ZOX:[id number] DOB: 1976-03-28 DOA: 01/24/2017  PCP: Dennis Bast, MD  Admit date: 01/24/2017 Discharge date: 01/26/2017  Recommendations for Outpatient Follow-up:  1.   Follow-up Information    Atilano Ina, MD. Go on 02/17/2017.   Specialty:  General Surgery Why:  AT [address] information: [address] [place] [address] [phone]        Atilano Ina, MD Follow up.   Specialty:  General Surgery Contact information: [address] [place] [address] [phone]          Discharge Diagnoses:  Principal Problem:   Morbid obesity (HCC) Active Problems:   Hiatal hernia with GERD   Essential hypertension   S/P gastric bypass with hiatal hernia repair   Surgical Procedure: Laparoscopic Roux-en-Y gastric bypass with hiatal hernia repair, upper endoscopy  Discharge Condition: Good Disposition: Home  Diet recommendation: Postoperative gastric bypass diet  Filed Weights   01/24/17 0521 01/25/17 0607 01/26/17 0518  Weight: 99 kg (218 lb 3.2 oz) 99.8 kg (220 lb) 99.5 kg (219 lb 6.4 oz)     Hospital Course:  The patient was admitted for a planned laparoscopic Roux-en-Y gastric bypass. Please see operative note. Preoperatively the patient was given 5000 units of subcutaneous heparin for DVT prophylaxis. Postoperative prophylactic Lovenox dosing was started on the morning of postoperative day 1. On pod 0 The patient was started on ice chips and water which they tolerated. On postoperative day 1 The patient's diet was advanced to protein shakes which they also tolerated. On POD 2 The patient was ambulating without difficulty. Their vital signs are stable without fever or tachycardia. Their hemoglobin had remained stable.  The patient had received discharge instructions and counseling. They were deemed stable for discharge.   Discharge Instructions  Discharge Instructions    Ambulate hourly while  awake    Complete by:  As directed    Call MD for:  difficulty breathing, headache or visual disturbances    Complete by:  As directed    Call MD for:  persistant dizziness or light-headedness    Complete by:  As directed    Call MD for:  persistant nausea and vomiting    Complete by:  As directed    Call MD for:  redness, tenderness, or signs of infection (pain, swelling, redness, odor or green/yellow discharge around incision site)    Complete by:  As directed    Call MD for:  severe uncontrolled pain    Complete by:  As directed    Call MD for:  temperature >101 F    Complete by:  As directed    Diet bariatric full liquid    Complete by:  As directed    Discharge instructions    Complete by:  As directed    See bariatric discharge instructions   Incentive spirometry    Complete by:  As directed    Perform hourly while awake     Allergies as of 01/26/2017      Reactions   Penicillins Rash   Has patient had a PCN reaction causing immediate rash, facial/tongue/throat swelling, SOB or lightheadedness with hypotension: Yes Has patient had a PCN reaction causing severe rash involving mucus membranes or skin necrosis: No Has patient had a PCN reaction that required hospitalization No Has patient had a PCN reaction occurring within the last 10 years: No If all of the above answers are "NO", then may proceed with Cephalosporin  use. Tolerates amoxicillin       Medication List    STOP taking these medications   bismuth subsalicylate 262 MG/15ML suspension Commonly known as:  PEPTO BISMOL   metoprolol succinate 100 MG 24 hr tablet Commonly known as:  TOPROL-XL   ranitidine 150 MG tablet Commonly known as:  ZANTAC     TAKE these medications   acetaminophen 500 MG tablet Commonly known as:  TYLENOL Take 1,000 mg by mouth 2 (two) times daily as needed for moderate pain or headache.   albuterol 108 (90 Base) MCG/ACT inhaler Commonly known as:  PROVENTIL HFA;VENTOLIN  HFA Inhale 2 puffs into the lungs every 6 (six) hours as needed for wheezing or shortness of breath.   ALLERGY EYE DROPS OP Apply 1 drop to eye daily as needed (allergies).   BIOTIN PO Take 1 capsule by mouth daily.   calcium carbonate 500 MG chewable tablet Commonly known as:  TUMS - dosed in mg elemental calcium Chew 2 tablets by mouth daily as needed for indigestion or heartburn.   cholecalciferol 1000 units tablet Commonly known as:  VITAMIN D Take 1,000 Units by mouth daily.   citalopram 40 MG tablet Commonly known as:  CELEXA Take 40 mg by mouth daily.   etonogestrel 68 MG Impl implant Commonly known as:  NEXPLANON by Subdermal route.   FISH OIL PO Take 1 capsule by mouth every evening.   loratadine 10 MG tablet Commonly known as:  CLARITIN Take 10 mg by mouth every evening.   metoprolol 50 MG tablet Commonly known as:  LOPRESSOR Take 1 tablet (50 mg total) by mouth 2 (two) times daily.   multivitamin capsule Take 1 capsule by mouth every evening.   Olopatadine HCl 0.6 % Soln Place 1 puff into the nose daily as needed (allergies).   ondansetron 4 MG disintegrating tablet Commonly known as:  ZOFRAN-ODT Take 4 mg by mouth every 6 (six) hours as needed for nausea/vomiting.   ondansetron 4 MG tablet Commonly known as:  ZOFRAN Take 1 tablet (4 mg total) by mouth every 8 (eight) hours as needed for nausea or vomiting.   oxyCODONE 5 MG/5ML solution Commonly known as:  ROXICODONE Take 5-10 mLs (5-10 mg total) by mouth every 4 (four) hours as needed for moderate pain or severe pain.   pantoprazole 40 MG tablet Commonly known as:  PROTONIX Take 40 mg by mouth daily.      Follow-up Information    Atilano Ina, MD. Go on 02/17/2017.   Specialty:  General Surgery Why:  AT [address] information: [address] [place] [address] [phone]        Atilano Ina, MD Follow up.   Specialty:  General Surgery Contact information: [address] [place] [address] [phone]            The results of significant diagnostics from this hospitalization (including imaging, microbiology, ancillary and laboratory) are listed below for reference.    Significant Diagnostic Studies: No results found.  Labs: Basic Metabolic Panel:  Recent Labs Lab 01/25/17 0509 01/26/17 0455  NA 138 138  K 4.0 4.0  CL 107 105  CO2 26 25  GLUCOSE 137* 130*  BUN 7 7  CREATININE 0.69 0.73  CALCIUM 9.0 8.9   Liver Function Tests:  Recent Labs Lab 01/25/17 0509 01/26/17 0455  AST 188* 320*  ALT 162* 348*  ALKPHOS 56 54  BILITOT 1.1 0.8  PROT 7.3 7.2  ALBUMIN 3.8 3.6    CBC:  Recent Labs Lab 01/24/17 1128 01/25/17 0509 01/26/17 0455  WBC  --  11.6* 10.0  NEUTROABS  --  8.5* 6.7  HGB 13.1 11.8* 12.1  HCT 38.3 34.9* 36.3  MCV  --  90.9 92.6  PLT  --  284 283    CBG: No results for input(s): GLUCAP in the last 168 hours.  Principal Problem:   Morbid obesity (HCC) Active Problems:   Hiatal hernia with GERD   Essential hypertension   S/P gastric bypass   Time coordinating discharge: 15 min  Signed:  Atilano Ina, MD [facility], [place] [phone] 01/31/2017, 10:42 AM

## 2017-02-03 ENCOUNTER — Telehealth (HOSPITAL_COMMUNITY): Payer: Self-pay

## 2017-02-03 NOTE — Telephone Encounter (Signed)
Left message for the patient contact information provided.     Made discharge phone call to patient. Asking the following questions.    1. Do you have someone to care for you now that you are home?   2. Are you having pain now that is not relieved by your pain medication?   3. Are you able to drink the recommended daily amount of fluids (48 ounces minimum/day) and protein (60-80 grams/day) as prescribed by the dietitian or nutritional counselor?   4. Are you taking the vitamins and minerals as prescribed?   5. Do you have the "on call" number to contact your surgeon if you have a problem or question?   6. Are your incisions free of redness, swelling or drainage? (If steri strips, address that these can fall off, shower as tolerated)  7. Have your bowels moved since your surgery?  If not, are you passing gas?   8. Are you up and walking 3-4 times per day?   9. Were you provided your discharge medications before your surgery or before you were discharged from the hospital and are you taking them without problem?

## 2017-02-08 ENCOUNTER — Encounter: Payer: 59 | Attending: General Surgery | Admitting: Skilled Nursing Facility1

## 2017-02-08 DIAGNOSIS — E669 Obesity, unspecified: Secondary | ICD-10-CM

## 2017-02-08 DIAGNOSIS — Z713 Dietary counseling and surveillance: Secondary | ICD-10-CM | POA: Diagnosis present

## 2017-02-09 ENCOUNTER — Encounter: Payer: Self-pay | Admitting: Skilled Nursing Facility1

## 2017-02-09 NOTE — Progress Notes (Signed)
Bariatric Class:  Appt start time: 1530 end time:  1630.  2 Week Post-Operative Nutrition Class  Patient was seen on 02/08/2017 for Post[facility]ative [facility]   Surgery date: 01/24/2017 Surgery type: RYGB Start weight at [facility]: 219.11 Weight today: 209.2  TANITA  BODY COMP RESULTS  02/08/2017   BMI (kg/m^2) 37.1   Fat Mass (lbs) 94.8   Fat Free Mass (lbs) 114.4   Total Body Water (lbs) 82.8   The following the learning objectives were met by the patient during this course:  Identifies Phase 3A (Soft, High Proteins) Dietary Goals and will begin from 2 weeks post-operatively to 2 months post-operatively  Identifies appropriate sources of fluids and proteins   States protein recommendations and appropriate sources post-operatively  Identifies the need for appropriate texture modifications, mastication, and bite sizes when consuming solids  Identifies appropriate multivitamin and calcium sources post-operatively  Describes the need for physical activity post-operatively and will follow MD recommendations  States when to call healthcare provider regarding medication questions or post-operative complications  Handouts given during class include:  Phase 3A: Soft, High Protein Diet Handout  Follow-Up Plan: Patient will follow-up at [facility] in 6 weeks for 2 month post-op nutrition visit for diet advancement per MD.

## 2017-03-21 NOTE — Addendum Note (Signed)
Addendum  created 03/21/17 1303 by Pia Jedlicka, MD   Sign clinical note    

## 2017-03-23 ENCOUNTER — Encounter: Payer: 59 | Attending: General Surgery | Admitting: Skilled Nursing Facility1

## 2017-03-23 ENCOUNTER — Encounter: Payer: Self-pay | Admitting: Skilled Nursing Facility1

## 2017-03-23 DIAGNOSIS — E669 Obesity, unspecified: Secondary | ICD-10-CM

## 2017-03-23 DIAGNOSIS — Z713 Dietary counseling and surveillance: Secondary | ICD-10-CM | POA: Diagnosis present

## 2017-03-23 NOTE — Patient Instructions (Addendum)
-  Rely on those external cues to remind you to drink and eat  -Try to take away one of the 15g protein shakes and eat some protein  -Give kefir a try in the yogurt ailse  -Add vegetables as tolerated eating the protein first

## 2017-03-23 NOTE — Progress Notes (Signed)
  Primary concerns today: Post-operative Bariatric Surgery Nutrition Management.  Pt states she is taking Citalopram, protonix, claritin. Pt states she has  2 cups of water at her desk and using packs of sugar free flavorings.Pt states socaily eat is difficult but chewing slowly helps. Pt states she tried Broccoli and caulfilower a few weeks ago without success. Pt states she has tried frozen custard and was very nausea. Pt states she is feeling very good and states Allergies are even better and looking at inches not weight works for her. Pt works for [employer] and they will offer workouits during work soon.   Surgery date: 01/24/2017 Surgery type: RYGB [facility].11 Weight today: 195 Weight Change: 14.2  TANITA  BODY COMP RESULTS  02/08/2017 03/23/2017   BMI (kg/m^2) 37.1 34.5   Fat Mass (lbs) 94.8 83.2   Fat Free Mass (lbs) 114.4 111.8   Total Body Water (lbs) 82.8 80.4   24-hr recall: B (7:30AM): premier protein shake (30g) Snk (9AM): egg or cheese (7g) L (11PM): 15g protein shake Snk (2PM): sugar free jello D (4PM): 15g protein shake Snk (PM): tacos: just the ground beef and cheese  Fluid intake: water with flavorings, decaf coffee: 64 fluid ounces  Estimated total protein intake: 88  Medications: see list Supplementation: multi and calcium  Using straws: no Drinking while eating: no Having you been chewing well:no Chewing/swallowing difficulties: no Changes in vision: no Changes to mood/headaches: no Hair loss/Cahnges to skin/Changes to nails: no Any difficulty focusing or concentrating: no Sweating: no Dizziness/Lightheaded: no Palpitations: no  Carbonated beverages: no N/V/D/C/GAS: constipation  Abdominal Pain: no Dumping syndrome: no  Recent physical activity:  Walking every day at work and [place] [employer] [place]   Progress Towards Goal(s):  In progress.  Handouts given during visit include:  NS veggies +  protein   Nutritional Diagnosis:  [place]-3.3 Overweight/obesity related to past poor dietary habits and physical inactivity as evidenced by patient w/ recent RYGB surgery following dietary guidelines for continued weight loss.    Intervention:  Nutrition counseling. Dietitian educated the pt on advancing her diet to include ns veggies.  Goals: -Rely on those external cues to remind you to drink and eat -Try to take away one of the 15g protein shakes and eat some protein -Give kefir a try in the yogurt ailse -Add vegetables as tolerated eating the protein first   Teaching Method Utilized:  Visual Auditory Hands on  Barriers to learning/adherence to lifestyle change: none identified   Demonstrated degree of understanding via:  Teach Back   Monitoring/Evaluation:  Dietary intake, exercise, lap band fills, and body weight.

## 2017-05-24 ENCOUNTER — Encounter: Payer: Self-pay | Admitting: Skilled Nursing Facility1

## 2017-05-24 ENCOUNTER — Encounter: Payer: 59 | Attending: General Surgery | Admitting: Skilled Nursing Facility1

## 2017-05-24 DIAGNOSIS — Z713 Dietary counseling and surveillance: Secondary | ICD-10-CM | POA: Insufficient documentation

## 2017-05-24 DIAGNOSIS — E669 Obesity, unspecified: Secondary | ICD-10-CM

## 2017-05-24 NOTE — Progress Notes (Signed)
  Primary concerns today: Post-operative Bariatric Surgery Nutrition Management.  Pt works for [employer] and they will offer workouits during work soon.  Pt states grapes causes a bubbly feeling in the stomach. Pt states she has a hard time with chicken. Pt states she is Using sectioned plates for meals. Pt states she is used to eating out now and being sure to eat slowly to keep pace everyone else because her portions are so much smaller than theirs.   Surgery date: 01/24/2017 Surgery type: RYGB [facility]: 219.11 Weight today: 176.6 Weight Change: 18.4  TANITA  BODY COMP RESULTS  02/08/2017 03/23/2017 05/24/2017   BMI (kg/m^2) 37.1 34.5 31.3   Fat Mass (lbs) 94.8 83.2 70.6   Fat Free Mass (lbs) 114.4 111.8 106   Total Body Water (lbs) 82.8 80.4 75.4   24-hr recall: B (7:30AM): premier protein shake (30g) Snk (9AM): egg or cheese (7g) L (11PM): some sort of fruit and tuna Snk (2PM):hard boiled egg  D (4:30PM): cheestick Snk (6PM): fish and nonstarchy veggies   Fluid intake: water with flavorings, decaf coffee: 80 fluid ounces  Estimated total protein intake: 88  Medications: see list Supplementation: multi and calcium  Using straws: no Drinking while eating: no Having you been chewing well:no Chewing/swallowing difficulties: no Changes in vision: no Changes to mood/headaches: no Hair loss/Cahnges to skin/Changes to nails: no Any difficulty focusing or concentrating: no Sweating: no Dizziness/Lightheaded: no Palpitations: no  Carbonated beverages: no N/V/D/C/GAS: constipation: not as much any more Abdominal Pain: no Dumping syndrome: yes; heart palpations and sweating from sipping on half and half sweet tea  Recent physical activity:  Walking every day at work and at [employer] [place] when its rainy: goal 8000 steps a day and started jogging for about a mile with her dogs  Progress Towards Goal(s):  In progress.     Nutritional Diagnosis:   [place]-3.3 Overweight/obesity related to past poor dietary habits and physical inactivity as evidenced by patient w/ recent RYGB surgery following dietary guidelines for continued weight loss.    Intervention:  Nutrition counseling. Dietitian educated the pt on advancing her diet to include ns veggies.  Goals: -Rely on those external cues to remind you to drink and eat -Try to take away one of the 15g protein shakes and eat some protein -Give kefir a try in the yogurt ailse -Add vegetables as tolerated eating the protein first  -Keep up the great work!  Teaching Method Utilized:  Visual Auditory Hands on  Barriers to learning/adherence to lifestyle change: none identified   Demonstrated degree of understanding via:  Teach Back   Monitoring/Evaluation:  Dietary intake, exercise, and body weight.

## 2017-09-27 ENCOUNTER — Ambulatory Visit: Payer: 59 | Admitting: Skilled Nursing Facility1

## 2017-09-30 ENCOUNTER — Encounter: Payer: 59 | Attending: General Surgery | Admitting: Skilled Nursing Facility1

## 2017-09-30 ENCOUNTER — Encounter: Payer: Self-pay | Admitting: Skilled Nursing Facility1

## 2017-09-30 DIAGNOSIS — Z713 Dietary counseling and surveillance: Secondary | ICD-10-CM | POA: Insufficient documentation

## 2017-09-30 DIAGNOSIS — E669 Obesity, unspecified: Secondary | ICD-10-CM

## 2017-09-30 DIAGNOSIS — Z9884 Bariatric surgery status: Secondary | ICD-10-CM | POA: Diagnosis not present

## 2017-09-30 NOTE — Patient Instructions (Addendum)
-  Get in to yoga 2 days a week  -Keep up the walking  -Try to have vegetables at least 2 times in a day  -You can cut your calcium down to 2 if your want

## 2017-09-30 NOTE — Progress Notes (Signed)
  Primary concerns today: Post-operative Bariatric Surgery Nutrition Management.  Pt state her weight goal is 140 pounds. Pt states she has started yoga this week. Pt states she has struggled getting enough fluid.   Pt has gained a healthy relationship with food.   Surgery date: 01/24/2017 [facility] Weight today: 158 Weight Change: 18.4  TANITA  BODY COMP RESULTS  02/08/2017 03/23/2017 05/24/2017 09/30/2017   BMI (kg/m^2) 37.1 34.5 31.3 28   Fat Mass (lbs) 94.8 83.2 70.6 59.6   Fat Free Mass (lbs) 114.4 111.8 106 98.6   Total Body Water (lbs) 82.8 80.4 75.4 69.4   24-hr recall: B (7:30AM): greek yogurt with gen pro protein powder Snk (11AM): banana or apple L (11PM):meat and cheese Snk (2PM):peanut butter crackers  D (4:30PM): cottage cheese Snk (6PM): fish or pork chop and nonstarchy veggies   1/4 cup of grape nuts   Fluid intake: water with flavorings, decaf coffee: 60 fluid ounces  Estimated total protein intake: 88  Medications: see list Supplementation: multi and calcium  Using straws: no Drinking while eating: no Having you been chewing well:no Chewing/swallowing difficulties: no Changes in vision: no Changes to mood/headaches: no Hair loss/Cahnges to skin/Changes to nails: no Any difficulty focusing or concentrating: no Sweating: no Dizziness/Lightheaded: no Palpitations: no  Carbonated beverages: no N/V/D/C/GAS: no Abdominal Pain: no Dumping syndrome: no  Recent physical activity:  Walking every day at work and at [employer] [place] when its rainy: goal 8000 steps a day and started jogging for about a mile with her dogs  Progress Towards Goal(s):  In progress.     Nutritional Diagnosis:  [place]-3.3 Overweight/obesity related to past poor dietary habits and physical inactivity as evidenced by patient w/ recent RYGB surgery following dietary guidelines for continued weight loss.    Intervention:  Nutrition  counseling. Dietitian educated the pt on advancing her diet to include ns veggies.  Goals: -Get in to yoga 2 days a week -Keep up the walking -Try to have vegetables at least 2 times in a day -You can cut your calcium down to 2 if your want -Keep up the great work!  Teaching Method Utilized:  Visual Auditory Hands on  Barriers to learning/adherence to lifestyle change: none identified   Demonstrated degree of understanding via:  Teach Back   Monitoring/Evaluation:  Dietary intake, exercise, and body weight.

## 2018-01-30 ENCOUNTER — Encounter: Payer: 59 | Attending: General Surgery | Admitting: Registered"

## 2018-01-30 ENCOUNTER — Encounter: Payer: Self-pay | Admitting: Registered"

## 2018-01-30 DIAGNOSIS — E669 Obesity, unspecified: Secondary | ICD-10-CM

## 2018-01-30 DIAGNOSIS — Z6826 Body mass index (BMI) 26.0-26.9, adult: Secondary | ICD-10-CM | POA: Diagnosis not present

## 2018-01-30 DIAGNOSIS — E663 Overweight: Secondary | ICD-10-CM | POA: Insufficient documentation

## 2018-01-30 DIAGNOSIS — Z713 Dietary counseling and surveillance: Secondary | ICD-10-CM | POA: Diagnosis not present

## 2018-01-30 NOTE — Patient Instructions (Signed)
-   Check out support group. [facility]    - Keep up the great work!

## 2018-01-30 NOTE — Progress Notes (Signed)
  Primary concerns today: Post-operative Bariatric Surgery Nutrition Management.  Non- scale victories: able to do yard work, more stamina, able to run, no longer hypertension, no longer taking blood pressure medications, responds better with taking benadryl, feels good, looks good  Surgery date: 01/24/2017 [facility] Weight today: 158, 150.6 Weight Change: 7.4 lbs from 158 lbs (09/30/2017) Total weight lost: 68.5 lbs Weight loss goals: repair hernia, improve quality of life, ~140 lbs but satisfied with where she is now   TANITA  BODY COMP RESULTS  02/08/2017 03/23/2017 05/24/2017 09/30/2017 01/30/2018   BMI (kg/m^2) 37.1 34.5 31.3 28 26.7   Fat Mass (lbs) 94.8 83.2 70.6 59.6 52.2   Fat Free Mass (lbs) 114.4 111.8 106 98.6 98.4   Total Body Water (lbs) 82.8 80.4 75.4 69.4 69.0   Pt states she has goal of 8000 steps/day and achieves it most days of the week. Pt states she does not drink protein shakes anymore. Pt states she has added in more fruit. Pt reports doing a great job listening to her body. Pt states she is unable to gulp water, has to sip. Pt states she is still working on not drinking with meals; realizes it is a habit she created when having reflux and hernia in order to help get food down. Pt is doing great!  24-hr recall: B (7:30AM): greek yogurt with gen pro protein powder, fruit  Snk (11AM): none L (11PM):meat and cheese or boiled egg Snk (2PM): fruit D (4:30PM): grape nuts, green, leafy vegetables Snk (6PM): fish or pork chop and nonstarchy veggies   1/4 cup of grape nuts   Fluid intake: water with flavorings, decaf and caffeinated coffee: 72 fluid ounces  Estimated total protein intake: 88  Medications: see list Supplementation: ProCare Health multi capsule and 3 calcium and BariMelts  Using straws: sometimes; no problems Drinking while eating: sometimes Having you been chewing well: yes Chewing/swallowing difficulties:  no Changes in vision: no Changes to mood/headaches: no Hair loss/Changes to skin/Changes to nails: improved Any difficulty focusing or concentrating: no Sweating: no Dizziness/Lightheaded: no Palpitations: no  Carbonated beverages: yes, Coke zero; no issues N/V/D/C/GAS: no Abdominal Pain: no Dumping syndrome: a few episodes, from pretzels and drinking with it  Recent physical activity:  Walking every day at work and at [employer] in [place] when its rainy: goal 8000 steps a day and started jogging for about a mile with her dogs 3 days/week  Progress Towards Goal(s):  In progress.     Nutritional Diagnosis:  [place]-3.3 Overweight/obesity related to past poor dietary habits and physical inactivity as evidenced by patient w/ recent RYGB surgery following dietary guidelines for continued weight loss.    Intervention:  Nutrition counseling. Dietitian educated the pt on maintenance phase of the diet.  Goals: - Check out support group. Next one [facility].   - Keep up the great work!   Teaching Method Utilized:  Visual Auditory Hands on  Barriers to learning/adherence to lifestyle change: none identified   Demonstrated degree of understanding via:  Teach Back   Monitoring/Evaluation:  Dietary intake, exercise, and body weight. Follow-up in 6 months for 18 month post-op visit.

## 2018-07-31 ENCOUNTER — Encounter: Payer: 59 | Attending: General Surgery | Admitting: Skilled Nursing Facility1

## 2018-07-31 ENCOUNTER — Encounter: Payer: Self-pay | Admitting: Skilled Nursing Facility1

## 2018-07-31 DIAGNOSIS — E663 Overweight: Secondary | ICD-10-CM | POA: Diagnosis not present

## 2018-07-31 NOTE — Progress Notes (Signed)
Primary concerns today: Post-operative Bariatric Surgery Nutrition Management.  Non- scale victories: able to do yard work, more stamina, able to run, no longer hypertension, no longer taking blood pressure medications, responds better with taking benadryl, feels good, looks good  Pt states she is not trying to lose any weight and is really happy with her current weight. Pt states she is not having any issues with emotional eating and not even on her radar any more. Pt states she is lactose intolerant. Pt states she is okay with taking 1 bite of sweet foods. Pt states since she was feeling lethargic she starting taking another iron which has helped. Pt states her energy level has been fine. Pt states her mom was diagnosed with breast cancer. Pt states her sleep is okay but her anxiety levels will increase obsession will get in the way of proper sleep. Pt states she has to watch out for her obsessions or they will take over. Pt states when her mother was diagnosed with breast cancer she had a "break down" and struggled with eating and got obsessive with cleaning her house.  Pt states she is working with a  Paramedic now and has joined [employer]. 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  Pt states she was 128 pounds in September.  Pt states she was losing her hair but now has noticed it has stopped falling out. Pt states she has had a lot of loss to in her family and friends which triggered her anxiety.  Dietitian educated the pt on her current appearance (signs of malnutrition) and the connection with her anxiety/alcolholism; dietitian also educated the pt on warning signs of an eating disorder and advised she speak with her therapist if anything comes up.  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    Surgery date: 01/24/2017 Surgery type: RYGB [facility] weight at [facility]: 219.11 Weight today: 123.2  lbs Weight Change: 27.4 lbs  Weight loss goals: repair hernia, improve quality of life, ~140 lbs but satisfied with where she is now   TANITA  BODY COMP RESULTS  05/24/2017 09/30/2017 01/30/2018 07/31/2018   BMI (kg/m^2) 31.3 28 26.7 21.8   Fat Mass (lbs) 70.6 59.6 52.2 30   Fat Free Mass (lbs) 106 98.6 98.4 93.2   Total Body Water (lbs) 75.4 69.4 69.0 64.2   Avoided foods: sugar foods, pasta, ketchup, pancakes, waffles, juice 24-hr recall: mini klondik bar 2 times a week  B (9AM): greek yogurt with gen pro protein powder Snk (10:30AM): fruit: 1 orange or banana or 1/2 cup grapes L (1-3PM): half peanut butter mayo sandwich without the crust or salad with berries and avacado and eggs and chicken Snk (2PM): fruit: 1 orange or banana or 1/2 cup grapes D (4:30PM): protein and 2 veggies Snk (6PM): dessert 2 times a day   Fluid intake: water with flavorings, decaf and caffeinated coffee: 72 fluid ounces; occasionally drinking diet soda  Estimated total protein intake: 88  Medications: see list Supplementation: ProCare Health multi capsule and 3 calcium and Iron  Using straws: sometimes; no problems Drinking while eating: sometimes Having you been chewing well: yes Chewing/swallowing difficulties: no Changes in vision: no Changes to mood/headaches: no Hair loss/Changes to skin/Changes to nails: improved Any difficulty focusing or concentrating: no Sweating: no Dizziness/Lightheaded: no Palpitations: no  Carbonated beverages: yes, Coke zero; no issues N/V/D/C/GAS: having a bowel  movement every other day  Abdominal Pain: no Dumping syndrome: a few episodes, from pretzels and drinking with it  Recent physical activity:  Walking every day at work aiming for 16109 steps and also walking her dogs 2 times a day  Progress Towards Goal(s):  In progress.   Nutritional Diagnosis:  -3.3 Overweight/obesity related to past poor dietary habits and physical inactivity as evidenced by  patient w/ recent RYGB surgery following dietary guidelines for continued weight loss.    Intervention:  Nutrition counseling. Dietitian educated the pt on maintenance phase of the diet.  Goals: -Eat every 3 hours 7 days a week -Do not be concerned if you gain weight  -Take another multivitamin for the next 2 weeks taken separately   Teaching Method Utilized:  Visual Auditory Hands on  Barriers to learning/adherence to lifestyle change: anxiety   Demonstrated degree of understanding via:  Teach Back   Monitoring/Evaluation:  Dietary intake, exercise, and body weight.

## 2018-08-01 ENCOUNTER — Ambulatory Visit: Payer: 59 | Admitting: Registered"

## 2018-08-03 ENCOUNTER — Telehealth: Payer: Self-pay | Admitting: Skilled Nursing Facility1

## 2018-08-03 NOTE — Telephone Encounter (Signed)
*Caution - External email - see footer for warningsUbaldo Ellis:  Here are the results from the blood draw on 03/12/2018.  I also had bloodwork done from Dr. Karle Starch and will try and send that to you as well.  Jamie Ellis  ----- Forwarded Message ----- From: Juanetta Snow [email or web address]> To: "[email or web address]" [email or web address]> Sent: Wednesday, August 02, 2018, 02:09:25 PM EDT Subject: Health Record from [facility]   Name: Jamie Ellis  Date of Birth: [birth year]  Gender Identity:  Female    Results Order:  HEMOGLOBIN GLYCLATED (HGB A1C) [id number])   Notes:    PATIENT WAS FASTING PERFORMED BY: [employer] [place] [id number]   Name  Date  Value  Units  Range  Source   Hemoglobin A1c  08/18/2016  5.70  %  4.8-5.6  [facility]   Notes:                                                              .         Pre-diabetes: 5.7 - 6.4         Diabetes: >6.4         Glycemic control for adults with diabetes: <7.0                                                          .         Pre-diabetes: 5.7 - 6.4         Diabetes: >6.4         Glycemic control for adults with diabetes: <7.0     Order:  VITAMIN D, 1, 25-DIHYDROXY ([id number])   Notes:    PATIENT WAS FASTING PERFORMED BY: [employer] [place] [id number]   Name  Date  Value  Units  Range  Source   Calcitriol(1,25 di-OH Vit D)  08/18/2016  21.20  pg/mL  19.9-79.3  [facility]      Order:  Kandis Ban ANALYSIS UREASE ACTIVITY [id number])   Notes:    PATIENT WAS FASTING PERFORMED BY: [employer] [place] [address] [place] [id number]   Name  Date  Value  Units  Range  Source   H. pylori Breath Test  08/18/2016  Negative   Negative  [facility]      Order:  IRON [id number])   Notes:    PATIENT WAS FASTING PERFORMED BY: [employer] [place] [address] [place] [id number]  [id number]   Name  Date  Value  Units  Range  Source   Iron, Serum  08/18/2016  112.00  ug/dL  27-159  [facility]      Order:  VITAMIN B-12 (CYANOCOBALAMIN) ([id number])   Notes:    PATIENT WAS FASTING PERFORMED BY: [employer] [place] [id number]   Name  Date  Value  Units  Range  Source   Vitamin B12  08/18/2016  439.00  pg/mL  211-946  [facility]      Order:  Vitamin B1 (Thiamine), Blood  Notes:    PATIENT WAS FASTING PERFORMED BY: [facility] [place] [id number]   Name  Date  Value  Units  Range  Source   Vit. B1, Whole Blood  08/18/2016  138.10  nmol/L  66.5-200.0  [facility]   Notes:        This test was developed and its performance characteristics determined by [facility]. It has not been cleared or approved by the Food and Drug Administration.    This test was developed and its performance characteristics determined by [facility]. It has not been cleared or approved by the Food and Drug Administration.     Order:  IRON BINDING CAPACITY (TIBC) ([id number])   Notes:    PATIENT NOT FASTING PERFORMED BY: [employer] [place] [id number]   Name  Date  Value  Units  Range  Source   Iron Bind.Cap.(TIBC)  03/12/2018  362.00  ug/dL  250-450  [facility]   UIBC  03/12/2018  311.00  ug/dL  131-425  [facility]   Iron  03/12/2018  51.00  ug/dL  27-159  [facility]   Iron Saturation  03/12/2018  14.00  %  15-55  [facility]      Order:  Vitamin B1 (Thiamine), Blood   Notes:    PATIENT NOT FASTING PERFORMED BY: [employer] [place] [id number]   Name  Date  Value  Units  Range  Source   Vit. B1, Whole Blood  03/12/2018  161.90  nmol/L  66.5-200.0  [facility]   Notes:        This test was developed and its performance characteristics  determined by [facility]. It has not been cleared or approved by the Food and Drug Administration.    This test was developed and its performance characteristics determined by [facility]. It has not been cleared or approved by the Food and Drug Administration.     Order:  CBC, PLATELETS & AUT DIFF ([id number])   Notes:    PATIENT NOT FASTING PERFORMED BY: [employer] [place] [id number]   Name  Date  Value  Units  Range  Source   WBC  03/12/2018  6.00  x10E3/uL  3.4-10.8  [facility]   RBC  03/12/2018  4.13  x10E6/uL  3.77-5.28  [facility]   Hemoglobin  03/12/2018  13.20  g/dL  11.1-15.9  [facility]   Hematocrit  03/12/2018  38.60  %  34.0-46.[facility]   MCV  03/12/2018  94.00  fL  79-97  [facility]   [facility]  03/12/2018  32.00  pg  26.6-33.0  [facility]   Neutrophils  03/12/2018  54.00  %  Not Estab.  [facility]   Lymphs  03/12/2018  36.00  %  Not Estab.  [facility]   Monocytes  03/12/2018  5.00  %  Not Estab.  [facility]   Eos  03/12/2018  5.00  %  Not Estab.  [facility]   Basos  03/12/2018  0.00  %  Not Estab.  [facility]   MCHC  03/12/2018  34.20  g/dL  31.5-35.7  [facility]   RDW  03/12/2018  13.50  %  12.3-15.4  [facility]   Platelets  03/12/2018  296.00  x10E3/uL  150-450  [facility]   Notes:                                    **Please note reference interval change**                                **Please note reference interval change**  Neutrophils (Absolute)  03/12/2018  3.20  x10E3/uL  1.4-7.0  [facility]   Lymphs (Absolute)  03/12/2018  2.20  x10E3/uL  0.7-3.1  [facility] [place]   Monocytes(Absolute)  03/12/2018  0.30  x10E3/uL  0.1-0.9  [facility] [place]   Eos (Absolute)  03/12/2018  0.30  x10E3/uL  0.0-0.4  [facility]   Baso  (Absolute)  03/12/2018  0.00  x10E3/uL  0.0-0.2  [facility]   Immature Granulocytes  03/12/2018  0.00  %  Not Estab.  [facility]   Immature Grans (Abs)  03/12/2018  0.00  x10E3/uL  0.0-0.1  [facility]      Order:  METABOLIC PANEL, COMPREHENSIVE [id number])   Notes:    PATIENT NOT FASTING PERFORMED BY: [employer] [address] [place] [id number]   Name  Date  Value  Units  Range  Source   BUN  03/12/2018  13.00  mg/dL  6-24  [facility]   Creatinine  03/12/2018  0.72  mg/dL  0.57-1.00  [facility]   eGFR If NonAfricn Am  03/12/2018  103.00  mL/min/1.73  [facility]   eGFR If Africn Am  03/12/2018  119.00  mL/min/1.73  >59  [facility]   BUN/Creatinine Ratio  03/12/2018  18.00   9-23  [facility]   Sodium  03/12/2018  144.00  mmol/L  134-144  [facility]   Potassium  03/12/2018  4.10  mmol/L  3.5-5.2  [facility]   Chloride  03/12/2018  105.00  mmol/L  96-106  [facility]   Carbon Dioxide, Total  03/12/2018  23.00  mmol/L  20-29  [facility]   Calcium  03/12/2018  9.40  mg/dL  8.7-10.2  [facility] [place] [facility]   Protein, Total  03/12/2018  7.00  g/dL  6.0-8.5  [facility]   Albumin  03/12/2018  4.80  g/dL  3.5-5.5  [facility]   Globulin, Total  03/12/2018  2.20  g/dL  1.5-4.5  [facility]   A/G Ratio  03/12/2018  2.20   1.2-2.2  [facility]   Bilirubin, Total  03/12/2018  0.30  mg/dL  0.0-1.2  [facility]   Alkaline Phosphatase  03/12/2018  83.00  IU/L  39-117  [facility]   AST (SGOT)  03/12/2018  24.00  IU/L  0-40  [facility]   ALT (SGPT)  03/12/2018  15.00  IU/L  0-32  [facility]   Glucose  03/12/2018  106.00  mg/dL  [facility]      Order:  FERRITIN ([id number])   Notes:    PATIENT NOT FASTING  PERFORMED BY: [employer] [place] [address] [place] [id number]   Name  Date  Value  Units  Range  Source   Ferritin, Serum  03/12/2018  60.00  ng/mL  15-150  [facility]  Order:  VITAMIN D, 1, 25-DIHYDROXY ([id number])   Notes:    PATIENT NOT FASTING PERFORMED BY: [employer] [address] [place] [id number]   Name  Date  Value  Units  Range  Source   Calcitriol(1,25 di-OH Vit D)  03/12/2018  65.00  pg/mL  19.9-79.3  [facility]       DISCLAIMER: This information is supplied from the patient's medical record via a patient portal. The medical provider is listed as the source. Items with a source of 'Patient-Entered' were added by the patient. This record may not be complete or up to date, and should not be used for providing medical advice. For an official copy of the individual's medical record, the patient (or custodian) must contact their medical provider.   Generated on 08/02/2018 2:09:23 PM by Juanetta Snow ([email or web address]).  WARNING: This email originated outside of [facility]. Even if this looks like a [employer], it is not. Do not provide your username, password, or any other personal information in response to this or any other email. [place] will never ask you for your username or password via email. DO NOT CLICK links or attachments unless you are positive the content is safe. If in doubt about the safety of this message, select the [employer] button, which forwards to IT Security.

## 2018-08-29 ENCOUNTER — Encounter: Payer: 59 | Attending: General Surgery | Admitting: Skilled Nursing Facility1

## 2018-08-29 DIAGNOSIS — E663 Overweight: Secondary | ICD-10-CM | POA: Insufficient documentation

## 2018-08-29 DIAGNOSIS — E669 Obesity, unspecified: Secondary | ICD-10-CM

## 2018-08-29 NOTE — Progress Notes (Signed)
Primary concerns today: Post-operative Bariatric Surgery Nutrition Management.  Non- scale victories: able to do yard work, more stamina, able to run, no longer hypertension, no longer taking blood pressure medications, responds better with taking benadryl, feels good, looks good  Alcoholism and eating disorder (purging) were a part of pts past.     Pt states she ate 2 cookies at her AA meeting without issue. Pt states she has been sleeping better and her hair has stopped with loss and has had regrowth. Pt states she feels she has more energy. Pt states she still struggles with her OCD. Pt states she ate sushi for the first time in 2 years because she absolutely loves it and was so excited to have eaten it. Pt states she is in a really good place and recognizes unhealthy thoughts and puts them away.  Pt was happy she has maintained her weight.  Pt states her dogs are the same breed but one is heftier than the other stating she has to feed them less people food.   Surgery date: 01/24/2017 Surgery type: RYGB Start weight at [facility]: 219.11 Weight today: 123 lbs  Weight Change: maintained   Weight loss goals: repair hernia, improve quality of life, ~140 lbs but satisfied with where she is now   TANITA  BODY COMP RESULTS  09/30/2017 01/30/2018 07/31/2018 08/30/2018   BMI (kg/m^2) 28 26.7 21.8 21.8   Fat Mass (lbs) 59.6 52.2 30 30.8   Fat Free Mass (lbs) 98.6 98.4 93.2 92.2   Total Body Water (lbs) 69.4 69.0 64.2 63.6   Avoided foods: sugar foods, pasta, ketchup, pancakes, waffles, juice 24-hr recall:  B (9AM): greek yogurt with gen pro protein powder or oatmeal with protein  Snk (10:30AM): fruit: 1 orange or banana or 1/2 cup grapes L (1-3PM): salad with chicken and blueberries or sandwich with peanut butter and mayo on white wheat without crust or pimento cheese sandwich without the crust Snk (2PM): protein bar and grapes or oranges D (4:30PM): fish or red meat and 2 veggies or sometimes  every now and then pasta or chicken chili Snk (6PM):   Fluid intake: water with flavorings, unsweet tea, decaf and caffeinated coffee: 72 fluid ounces; sparkling water Estimated total protein intake: 88  Medications: see list Supplementation: [employer] multi capsule and 3 calcium and Iron + vitamin C, biotin  Using straws: sometimes; no problems Drinking while eating: sometimes Having you been chewing well: yes Chewing/swallowing difficulties: no Changes in vision: no Changes to mood/headaches: no Hair loss/Changes to skin/Changes to nails: improved Any difficulty focusing or concentrating: no Sweating: no Dizziness/Lightheaded: no Palpitations: no  Carbonated beverages: yes N/V/D/C/GAS: having a bowel movement every day Abdominal Pain: no Dumping syndrome: no  Recent physical activity:  Walking every day at work aiming for 40981 steps and also walking her dogs 2 times a day 7 days a week  Progress Towards Goal(s):  In progress.   Nutritional Diagnosis:  [place]-3.3 Overweight/obesity related to past poor dietary habits and physical inactivity as evidenced by patient w/ recent RYGB surgery following dietary guidelines for continued weight loss.    Intervention:  Nutrition counseling. Dietitian educated the pt on maintenance phase of the diet.  Goals: -Eat every 3 hours 7 days a week -Continue to eat balanced  -Continue to eat at least 2 energy sources every day along with protein and non starchy vegetables   Teaching Method Utilized:  Visual Auditory Hands on  Barriers to learning/adherence to lifestyle change: anxiety/OCD  Demonstrated degree of understanding via:  Teach Back   Monitoring/Evaluation:  Dietary intake, exercise, and body weight.

## 2018-09-12 ENCOUNTER — Ambulatory Visit: Payer: 59 | Admitting: Skilled Nursing Facility1

## 2018-11-24 ENCOUNTER — Telehealth: Payer: Self-pay | Admitting: Genetic Counselor

## 2018-11-24 NOTE — Telephone Encounter (Signed)
Her daughter wants to come in for testing, she has her own insurance.  We discussed the testing, and costs.  Most people pay less than $100.  Patient pay is $250.

## 2018-11-29 ENCOUNTER — Ambulatory Visit: Payer: 59 | Admitting: Skilled Nursing Facility1

## 2019-08-22 ENCOUNTER — Encounter (HOSPITAL_COMMUNITY): Payer: Self-pay

## 2020-06-24 ENCOUNTER — Emergency Department (HOSPITAL_BASED_OUTPATIENT_CLINIC_OR_DEPARTMENT_OTHER): Payer: BC Managed Care – PPO

## 2020-06-24 ENCOUNTER — Other Ambulatory Visit: Payer: Self-pay

## 2020-06-24 ENCOUNTER — Encounter (HOSPITAL_BASED_OUTPATIENT_CLINIC_OR_DEPARTMENT_OTHER): Payer: Self-pay

## 2020-06-24 ENCOUNTER — Emergency Department (HOSPITAL_BASED_OUTPATIENT_CLINIC_OR_DEPARTMENT_OTHER)
Admission: EM | Admit: 2020-06-24 | Discharge: 2020-06-25 | Disposition: A | Discharge status: Home / Self Care | Payer: BC Managed Care – PPO | Attending: Emergency Medicine | Admitting: Emergency Medicine

## 2020-06-24 DIAGNOSIS — I10 Essential (primary) hypertension: Secondary | ICD-10-CM | POA: Insufficient documentation

## 2020-06-24 DIAGNOSIS — Z79899 Other long term (current) drug therapy: Secondary | ICD-10-CM | POA: Insufficient documentation

## 2020-06-24 DIAGNOSIS — R0602 Shortness of breath: Secondary | ICD-10-CM | POA: Insufficient documentation

## 2020-06-24 DIAGNOSIS — R197 Diarrhea, unspecified: Secondary | ICD-10-CM | POA: Diagnosis not present

## 2020-06-24 DIAGNOSIS — J069 Acute upper respiratory infection, unspecified: Secondary | ICD-10-CM | POA: Diagnosis not present

## 2020-06-24 DIAGNOSIS — Z20822 Contact with and (suspected) exposure to covid-19: Secondary | ICD-10-CM | POA: Insufficient documentation

## 2020-06-24 DIAGNOSIS — R509 Fever, unspecified: Secondary | ICD-10-CM | POA: Diagnosis present

## 2020-06-24 DIAGNOSIS — J45909 Unspecified asthma, uncomplicated: Secondary | ICD-10-CM | POA: Diagnosis not present

## 2020-06-24 DIAGNOSIS — R0789 Other chest pain: Secondary | ICD-10-CM | POA: Insufficient documentation

## 2020-06-24 LAB — CBC
HCT: 39.7 % (ref 36.0–46.0)
Hemoglobin: 14.1 g/dL (ref 12.0–15.0)
MCH: 32.4 pg (ref 26.0–34.0)
MCHC: 35.5 g/dL (ref 30.0–36.0)
MCV: 91.3 fL (ref 80.0–100.0)
Platelets: 224 10*3/uL (ref 150–400)
RBC: 4.35 MIL/uL (ref 3.87–5.11)
RDW: 12.2 % (ref 11.5–15.5)
WBC: 5.8 10*3/uL (ref 4.0–10.5)
nRBC: 0 % (ref 0.0–0.2)

## 2020-06-24 LAB — BASIC METABOLIC PANEL
Anion gap: 14 (ref 5–15)
BUN: 11 mg/dL (ref 6–20)
CO2: 21 mmol/L — ABNORMAL LOW (ref 22–32)
Calcium: 8.8 mg/dL — ABNORMAL LOW (ref 8.9–10.3)
Chloride: 108 mmol/L (ref 98–111)
Creatinine, Ser: 0.53 mg/dL (ref 0.44–1.00)
GFR calc Af Amer: >60 mL/min (ref >60)
GFR calc non Af Amer: >60 mL/min (ref >60)
Glucose, Bld: 98 mg/dL (ref 70–99)
Potassium: 3.5 mmol/L (ref 3.5–5.1)
Sodium: 143 mmol/L (ref 135–145)

## 2020-06-24 LAB — TROPONIN I (HIGH SENSITIVITY): Troponin I (High Sensitivity): 3 ng/L (ref <18)

## 2020-06-24 MED ORDER — SODIUM CHLORIDE 0.9 % IV BOLUS
1000.0000 mL | Freq: Once | INTRAVENOUS | Status: AC
  Administered 2020-06-25: 1000 mL via INTRAVENOUS

## 2020-06-24 MED ORDER — DEXAMETHASONE SODIUM PHOSPHATE 10 MG/ML IJ SOLN
10.0000 mg | Freq: Once | INTRAMUSCULAR | Status: AC
  Administered 2020-06-25: 10 mg via INTRAVENOUS
  Filled 2020-06-24: qty 1

## 2020-06-24 NOTE — ED Triage Notes (Signed)
Pt arrives with c/o increased SOB states that she was Covid tested last week and it was negative, reports she started having fevers today and fatigue.

## 2020-06-24 NOTE — ED Provider Notes (Signed)
[place] EMERGENCY DEPARTMENT Provider Note   CSN: [id number] Arrival date & time: 06/24/20  1846     History Chief Complaint  Patient presents with  . Shortness of Breath    Jamie Ellis is a 44 y.o. female.  HPI     This is a 44 year old female with a history of asthma and hypertension who presents with fever, chest pressure, cough.  Patient reports that 1 week ago she had some upper respiratory symptoms including cough and shortness of breath.  She also had some diarrhea.  She is fully vaccinated against COVID-19 and had a negative Covid test at that time.  She has not had any known sick contacts.  She states that since that time she has had persistent chest pressure.  Nothing seems to make it better or worse.  It is not exertional.  Denies any pleuritic nature of the pain.  She has had persistent nonproductive cough that is sometimes improved with inhaler.  She reports a fever today to 100.1.  She also reports generalized fatigue and malaise.  She reports well-controlled asthma without any recent hospitalizations.  Past Medical History:  Diagnosis Date  . Asthma   . Family history of Lynch syndrome   . Family history of rectal cancer   . Hypertension   . Lynch syndrome   . Migraines    hx of  . PONV (postoperative nausea and vomiting)    [place] and sob  . Seasonal allergies     Patient Active Problem List   Diagnosis Date Noted  . Morbid obesity ([place]) 01/24/2017  . Hiatal hernia with GERD 01/24/2017  . Essential hypertension 01/24/2017  . S/P gastric bypass 01/24/2017  . Lynch syndrome 05/06/2016  . Genetic testing 05/06/2016  . Family history of Lynch syndrome   . Family history of rectal cancer     Past Surgical History:  Procedure Laterality Date  . ABDOMINAL HYSTERECTOMY     total  . APPENDECTOMY    . CHOLECYSTECTOMY    . LAPAROSCOPIC ROUX-EN-Y GASTRIC BYPASS WITH HIATAL HERNIA REPAIR N/A 01/24/2017   Procedure: LAPAROSCOPIC ROUX-EN-Y GASTRIC  BYPASS WITH HIATAL HERNIA REPAIR, UPPER ENDO;  Surgeon: Greer Pickerel, MD;  Location: [facility];  Service: General;  Laterality: N/A;  . NASAL SINUS SURGERY    . TONSILLECTOMY       OB History   No obstetric history on file.     Family History  Problem Relation Age of Onset  . Rectal cancer Father 15       MSH6 - Lynch syndrome  . Skin cancer Paternal Grandmother   . Prostate cancer Paternal Grandfather 45    Social History   Tobacco Use  . Smoking status: Never Smoker  . Smokeless tobacco: Never Used  Substance Use Topics  . Alcohol use: Yes    Alcohol/week: 2.0 standard drinks    Types: 2 Glasses of wine per week    Comment: 3 times wk  . Drug use: No    Home Medications Prior to Admission medications   Medication Sig Start Date End Date Taking? Authorizing Provider  acetaminophen (TYLENOL) 500 MG tablet Take 1,000 mg by mouth 2 (two) times daily as needed for moderate pain or headache.    [provider]  albuterol (PROVENTIL HFA;VENTOLIN HFA) 108 (90 Base) MCG/ACT inhaler Inhale 2 puffs into the lungs every 6 (six) hours as needed for wheezing or shortness of breath.    [provider]  BIOTIN PO Take 1 capsule  by mouth daily.    [provider]  calcium carbonate (TUMS - DOSED IN MG ELEMENTAL CALCIUM) 500 MG chewable tablet Chew 2 tablets by mouth daily as needed for indigestion or heartburn.    [provider]  cholecalciferol (VITAMIN D) 1000 units tablet Take 1,000 Units by mouth daily.    [provider]  citalopram (CELEXA) 40 MG tablet Take 40 mg by mouth daily.    [provider]  etonogestrel (NEXPLANON) 67 MG IMPL implant by Subdermal route.    [provider]  Ketotifen Fumarate (ALLERGY EYE DROPS OP) Apply 1 drop to eye daily as needed (allergies).    [provider]  loratadine (CLARITIN) 10 MG tablet Take 10 mg by mouth every evening.     [provider]  metoprolol (LOPRESSOR) 50  MG tablet Take 1 tablet (50 mg total) by mouth 2 (two) times daily. 01/26/17   Greer Pickerel, MD  Multiple Vitamin (MULTIVITAMIN) capsule Take 1 capsule by mouth every evening.     [provider]  Olopatadine HCl 0.6 % SOLN Place 1 puff into the nose daily as needed (allergies).    [provider]  Omega-3 Fatty Acids (FISH OIL PO) Take 1 capsule by mouth every evening.    [provider]  ondansetron (ZOFRAN) 4 MG tablet Take 1 tablet (4 mg total) by mouth every 8 (eight) hours as needed for nausea or vomiting. 12/16/14   Pisciotta, Elmyra Ricks, PA-C  ondansetron (ZOFRAN-ODT) 4 MG disintegrating tablet Take 4 mg by mouth every 6 (six) hours as needed for nausea/vomiting. 01/13/17   [provider]  oxyCODONE (ROXICODONE) 5 MG/5ML solution Take 5-10 mLs (5-10 mg total) by mouth every 4 (four) hours as needed for moderate pain or severe pain. 01/26/17   Greer Pickerel, MD  pantoprazole (PROTONIX) 40 MG tablet Take 40 mg by mouth daily.    [provider]  TRINTELLIX 20 MG TABS tablet Take 20 mg by mouth daily. 06/04/20   [provider]    Allergies    Penicillins  Review of Systems   Review of Systems  Constitutional: Positive for fever.  Respiratory: Positive for cough and shortness of breath.   Cardiovascular: Positive for chest pain. Negative for leg swelling.  Gastrointestinal: Positive for diarrhea. Negative for abdominal pain, nausea and vomiting.  Genitourinary: Negative for dysuria.  All other systems reviewed and are negative.   Physical Exam Updated Vital Signs BP (!) 136/96 (BP Location: Left Arm)   Pulse 70   Temp 98.1 F (36.7 C) (Oral)   Resp 14   Ht 1.6 m (_0 )   Wt 54.4 kg   LMP 02/29/2016 Comment: total  SpO2 100%   BMI 21.26 kg/m   Physical Exam Vitals and nursing note reviewed.  Constitutional:      Appearance: She is well-developed. She is not ill-appearing.  HENT:     Head: Normocephalic and atraumatic.    Eyes:     Pupils: Pupils are equal, round, and reactive to light.  Cardiovascular:     Rate and Rhythm: Normal rate and regular rhythm.     Heart sounds: Normal heart sounds.  Pulmonary:     Effort: Pulmonary effort is normal. No accessory muscle usage or respiratory distress.     Breath sounds: Normal breath sounds. No wheezing.  Chest:     Chest wall: No mass.  Abdominal:     General: Bowel sounds are normal.     Palpations: Abdomen is  soft.  Musculoskeletal:     Cervical back: Neck supple.     Right lower leg: No tenderness. No edema.     Left lower leg: No tenderness. No edema.  Skin:    General: Skin is warm and dry.  Neurological:     Mental Status: She is alert and oriented to person, place, and time.  Psychiatric:        Mood and Affect: Mood normal.     ED Results / Procedures / Treatments   Labs (all labs ordered are listed, but only abnormal results are displayed) Labs Reviewed  BASIC METABOLIC PANEL - Abnormal; Notable for the following components:      Result Value   CO2 21 (*)    Calcium 8.8 (*)    All other components within normal limits  SARS CORONAVIRUS 2 BY RT PCR (HOSPITAL ORDER, [place] [facility])  CBC  TROPONIN I (HIGH SENSITIVITY)  TROPONIN I (HIGH SENSITIVITY)    EKG EKG Interpretation  Date/Time:  Tuesday June 24 2020 19:15:25 EDT Ventricular Rate:  106 PR Interval:  134 QRS Duration: 78 QT Interval:  348 QTC Calculation: 462 R Axis:   69 Text Interpretation: Sinus tachycardia Otherwise normal ECG Confirmed by Thayer Jew [phone]) on 06/24/2020 11:23:55 PM   Radiology DG Chest Portable 1 View  Result Date: 06/24/2020 CLINICAL DATA:  Chest pain short of breath EXAM: PORTABLE CHEST 1 VIEW COMPARISON:  11/27/2018 FINDINGS: The heart size and mediastinal contours are within normal limits. Both lungs are clear. The visualized skeletal structures are unremarkable. IMPRESSION: No active disease. Electronically  Signed   By: Donavan Foil M.D.   On: 06/24/2020 20:43    Procedures Procedures (including critical care time)  Medications Ordered in ED Medications  dexamethasone (DECADRON) injection 10 mg (10 mg Intravenous Given 06/25/20 0036)  sodium chloride 0.9 % bolus 1,000 mL (1,000 mLs Intravenous New Bag/Given 06/25/20 0034)    ED Course  I have reviewed the triage vital signs and the nursing notes.  Pertinent labs & imaging results that were available during my care of the patient were reviewed by me and considered in my medical decision making (see chart for details).    MDM Rules/Calculators/A&P                           Patient presents with upper respiratory symptoms, fever, cough, chest discomfort, fatigue.  She is overall nontoxic and vital signs are reassuring.  She is afebrile here.  She was initially tachycardic but pulse rate improved to 70.  She does state that she used her inhaler in the waiting room.  She is currently without wheeze on exam and and is in no respiratory distress.  Given prevalence of COVID-19, repeat Covid testing was sent.  She is low risk given her vaccine status although she does have a history of asthma.  EKG shows no evidence of acute ischemia or arrhythmia.  Chest x-ray was independently reviewed by myself and shows no evidence of pneumonia or pneumothorax.  Low suspicion for ACS given nature of symptoms and URI symptoms, troponin x2 -.  Suspect viral etiology.  Covid testing is negative.  Patient was given a dose of Decadron given her asthma history.  She was also given fluids given her general fatigue and myalgias.  Recommend continuing inhalers daily for 4 to 6 hours as needed.  Follow-up closely with primary physician.  While patient was initially tachycardic, feel  this is likely related to her inhalers.  Lower suspicion for PE at this time.  Julicia Krieger was evaluated in Emergency Department on 06/25/2020 for the symptoms described in the history of present  illness. She was evaluated in the context of the global COVID-19 pandemic, which necessitated consideration that the patient might be at risk for infection with the SARS-CoV-2 virus that causes COVID-19. Institutional protocols and algorithms that pertain to the evaluation of patients at risk for COVID-19 are in a state of rapid change based on information released by regulatory bodies including the CDC and federal and state organizations. These policies and algorithms were followed during the patient's care in the ED.  After history, exam, and medical workup I feel the patient has been appropriately medically screened and is safe for discharge home. Pertinent diagnoses were discussed with the patient. Patient was given return precautions.   Final Clinical Impression(s) / ED Diagnoses Final diagnoses:  Viral URI with cough  Atypical chest pain    Rx / DC Orders ED Discharge Orders    None       Rafaela Dinius, Barbette Hair, MD 06/25/20 0131

## 2020-06-25 LAB — SARS CORONAVIRUS 2 BY RT PCR (HOSPITAL ORDER, PERFORMED IN ~~LOC~~ HOSPITAL LAB): SARS Coronavirus 2: NEGATIVE

## 2020-06-25 LAB — TROPONIN I (HIGH SENSITIVITY): Troponin I (High Sensitivity): 3 ng/L (ref <18)

## 2020-06-25 NOTE — Discharge Instructions (Addendum)
You were seen today for upper respiratory symptoms and atypical chest pain.  Your work-up is largely reassuring including chest x-ray.  You were treated with steroids given your history of asthma.  Continue your inhaler every 4-6 hours.  Follow-up closely with your primary physician.  If symptoms worsen, you should be reevaluated.

## 2021-02-15 ENCOUNTER — Encounter (HOSPITAL_BASED_OUTPATIENT_CLINIC_OR_DEPARTMENT_OTHER): Payer: Self-pay

## 2021-02-15 ENCOUNTER — Emergency Department (HOSPITAL_BASED_OUTPATIENT_CLINIC_OR_DEPARTMENT_OTHER): Payer: 59

## 2021-02-15 ENCOUNTER — Other Ambulatory Visit: Payer: Self-pay

## 2021-02-15 ENCOUNTER — Emergency Department (HOSPITAL_BASED_OUTPATIENT_CLINIC_OR_DEPARTMENT_OTHER)
Admission: EM | Admit: 2021-02-15 | Discharge: 2021-02-15 | Disposition: A | Discharge status: Home / Self Care | Payer: 59 | Attending: Emergency Medicine | Admitting: Emergency Medicine

## 2021-02-15 DIAGNOSIS — I10 Essential (primary) hypertension: Secondary | ICD-10-CM | POA: Insufficient documentation

## 2021-02-15 DIAGNOSIS — S0101XA Laceration without foreign body of scalp, initial encounter: Secondary | ICD-10-CM

## 2021-02-15 DIAGNOSIS — W228XXA Striking against or struck by other objects, initial encounter: Secondary | ICD-10-CM | POA: Insufficient documentation

## 2021-02-15 DIAGNOSIS — J45909 Unspecified asthma, uncomplicated: Secondary | ICD-10-CM | POA: Insufficient documentation

## 2021-02-15 DIAGNOSIS — R55 Syncope and collapse: Secondary | ICD-10-CM

## 2021-02-15 DIAGNOSIS — S0181XA Laceration without foreign body of other part of head, initial encounter: Secondary | ICD-10-CM | POA: Diagnosis not present

## 2021-02-15 DIAGNOSIS — S0990XA Unspecified injury of head, initial encounter: Secondary | ICD-10-CM | POA: Diagnosis present

## 2021-02-15 DIAGNOSIS — Z79899 Other long term (current) drug therapy: Secondary | ICD-10-CM | POA: Diagnosis not present

## 2021-02-15 NOTE — ED Provider Notes (Signed)
[facility] Provider Note   CSN: [id number] Arrival date & time: 02/15/21  1405     History Chief Complaint  Patient presents with  . Laceration  . Loss of Consciousness    Jamie Ellis is a 45 y.o. female.  Patient was gardening, doing a lot of standing and squatting.  Did not drink well today.  Use caffeine.  Had some lightheadedness and then had a syncopal episode.  At some point she struck her head.  She is unsure if that was with a syncopal episode or if she hit her head and then syncopized.  She denies any symptoms currently other than headache and pain at the laceration site.  She has no significant cardiac history.  No new medications.  Is back to her baseline.        Past Medical History:  Diagnosis Date  . Asthma   . Family history of Lynch syndrome   . Family history of rectal cancer   . Hypertension   . Lynch syndrome   . Migraines    hx of  . PONV (postoperative nausea and vomiting)    [place] and sob  . Seasonal allergies     Patient Active Problem List   Diagnosis Date Noted  . Morbid obesity ([place]) 01/24/2017  . Hiatal hernia with GERD 01/24/2017  . Essential hypertension 01/24/2017  . S/P gastric bypass 01/24/2017  . Lynch syndrome 05/06/2016  . Genetic testing 05/06/2016  . Family history of Lynch syndrome   . Family history of rectal cancer     Past Surgical History:  Procedure Laterality Date  . ABDOMINAL HYSTERECTOMY     total  . APPENDECTOMY    . CHOLECYSTECTOMY    . LAPAROSCOPIC ROUX-EN-Y GASTRIC BYPASS WITH HIATAL HERNIA REPAIR N/A 01/24/2017   Procedure: LAPAROSCOPIC ROUX-EN-Y GASTRIC BYPASS WITH HIATAL HERNIA REPAIR, UPPER ENDO;  Surgeon: Greer Pickerel, MD;  Location: [facility];  Service: General;  Laterality: N/A;  . NASAL SINUS SURGERY    . TONSILLECTOMY       OB History   No obstetric history on file.     Family History  Problem Relation Age of Onset  . Rectal cancer Father 79       MSH6 - Lynch  syndrome  . Skin cancer Paternal Grandmother   . Prostate cancer Paternal Grandfather 48    Social History   Tobacco Use  . Smoking status: Never Smoker  . Smokeless tobacco: Never Used  Substance Use Topics  . Alcohol use: Not Currently  . Drug use: No    Home Medications Prior to Admission medications   Medication Sig Start Date End Date Taking? Authorizing Provider  acetaminophen (TYLENOL) 500 MG tablet Take 1,000 mg by mouth 2 (two) times daily as needed for moderate pain or headache.    [provider]  albuterol (PROVENTIL HFA;VENTOLIN HFA) 108 (90 Base) MCG/ACT inhaler Inhale 2 puffs into the lungs every 6 (six) hours as needed for wheezing or shortness of breath.    [provider]  BIOTIN PO Take 1 capsule by mouth daily.    [provider]  calcium carbonate (TUMS - DOSED IN MG ELEMENTAL CALCIUM) 500 MG chewable tablet Chew 2 tablets by mouth daily as needed for indigestion or heartburn.    [provider]  cholecalciferol (VITAMIN D) 1000 units tablet Take 1,000 Units by mouth daily.    [provider]  citalopram (CELEXA) 40 MG tablet Take 40 mg by mouth daily.  [provider]  etonogestrel (NEXPLANON) 39 MG IMPL implant by Subdermal route.    [provider]  Ketotifen Fumarate (ALLERGY EYE DROPS OP) Apply 1 drop to eye daily as needed (allergies).    [provider]  loratadine (CLARITIN) 10 MG tablet Take 10 mg by mouth every evening.     [provider]  metoprolol (LOPRESSOR) 50 MG tablet Take 1 tablet (50 mg total) by mouth 2 (two) times daily. 01/26/17   Greer Pickerel, MD  Multiple Vitamin (MULTIVITAMIN) capsule Take 1 capsule by mouth every evening.     [provider]  Olopatadine HCl 0.6 % SOLN Place 1 puff into the nose daily as needed (allergies).    [provider]  Omega-3 Fatty Acids (FISH OIL PO) Take 1 capsule by mouth every evening.    [provider]  ondansetron (ZOFRAN) 4 MG tablet Take 1 tablet (4 mg total) by mouth every 8 (eight) hours as needed for nausea or vomiting. 12/16/14   Pisciotta, Elmyra Ricks, PA-C  ondansetron (ZOFRAN-ODT) 4 MG disintegrating tablet Take 4 mg by mouth every 6 (six) hours as needed for nausea/vomiting. 01/13/17   [provider]  oxyCODONE (ROXICODONE) 5 MG/5ML solution Take 5-10 mLs (5-10 mg total) by mouth every 4 (four) hours as needed for moderate pain or severe pain. 01/26/17   Greer Pickerel, MD  pantoprazole (PROTONIX) 40 MG tablet Take 40 mg by mouth daily.    [provider]  TRINTELLIX 20 MG TABS tablet Take 20 mg by mouth daily. 06/04/20   [provider]    Allergies    Patient has no active allergies.  Review of Systems   Review of Systems  Constitutional: Negative for chills and fever.  HENT: Negative for congestion and rhinorrhea.   Respiratory: Negative for cough and shortness of breath.   Cardiovascular: Negative for chest pain and palpitations.  Gastrointestinal: Negative for diarrhea, nausea and vomiting.  Genitourinary: Negative for difficulty urinating and dysuria.  Musculoskeletal: Negative for arthralgias and back pain.  Skin: Positive for wound. Negative for rash.  Neurological: Positive for syncope and headaches. Negative for light-headedness.    Physical Exam Updated Vital Signs BP 119/78 (BP Location: Right Arm)   Pulse 89   Temp 98.8 F (37.1 C) (Oral)   Resp 18   Ht 5' 2" (1.575 m)   Wt 59 kg   LMP 02/29/2016 Comment: total  SpO2 100%   BMI 23.78 kg/m   Physical Exam Vitals and nursing note reviewed. Exam conducted with a chaperone present.  Constitutional:      General: She is not in acute distress.    Appearance: Normal appearance.  HENT:     Head: Normocephalic.     Comments: Small 1.5 cm horizontal laceration at the vertex of the calvaria.  He hemostatic.  No crepitus no significant tenderness palpation no large hematoma.    Nose: No  rhinorrhea.  Eyes:     General:        Right eye: No discharge.        Left eye: No discharge.     Extraocular Movements: Extraocular movements intact.     Conjunctiva/sclera: Conjunctivae normal.     Pupils: Pupils are equal, round, and reactive to light.  Cardiovascular:     Rate and Rhythm: Normal rate and regular rhythm.  Pulmonary:     Effort: Pulmonary effort is normal. No respiratory distress.     Breath sounds: No stridor.  Abdominal:  General: Abdomen is flat. There is no distension.     Palpations: Abdomen is soft.  Musculoskeletal:        General: No tenderness or signs of injury.  Skin:    General: Skin is warm and dry.     Capillary Refill: Capillary refill takes less than 2 seconds.  Neurological:     General: No focal deficit present.     Mental Status: She is alert. Mental status is at baseline.     Cranial Nerves: No cranial nerve deficit.     Sensory: No sensory deficit.     Motor: No weakness.     Coordination: Coordination normal.     Gait: Gait normal.  Psychiatric:        Mood and Affect: Mood normal.        Behavior: Behavior normal.     ED Results / Procedures / Treatments   Labs (all labs ordered are listed, but only abnormal results are displayed) Labs Reviewed - No data to display  EKG None  Radiology No results found.  Procedures .Marland KitchenLaceration Repair  Date/Time: 02/15/2021 4:12 PM Performed by: Breck Coons, MD Authorized by: Breck Coons, MD   Consent:    Consent obtained:  Verbal   Consent given by:  Patient   Risks discussed:  Infection, pain, poor cosmetic result, need for additional repair and poor wound healing   Alternatives discussed:  No treatment Laceration details:    Location:  Scalp   Scalp location:  Crown   Length (cm):  1.5 Exploration:    Contaminated: no   Treatment:    Area cleansed with:  Saline   Amount of cleaning:  Standard   Irrigation solution:  Sterile saline   Debridement:  None Skin repair:     Repair method:  Staples   Number of staples:  2 Approximation:    Approximation:  Close Repair type:    Repair type:  Intermediate Post-procedure details:    Dressing:  Open (no dressing)   Procedure completion:  Tolerated well, no immediate complications     Medications Ordered in ED Medications - No data to display  ED Course  I have reviewed the triage vital signs and the nursing notes.  Pertinent labs & imaging results that were available during my care of the patient were reviewed by me and considered in my medical decision making (see chart for details).    MDM Rules/Calculators/A&P                          Head laceration and fall after syncope with position change while gardening.  Will get CT scan of the head.  EKG reviewed by me shows normal sinus rhythm no acute ischemic change interval abnormality arrhythmia.  Patient has no significant health risk factors for coronary artery disease.  She had no chest pain.  Felt symptoms of near syncope with position change while gardening.  Seems very suspect for vasovagal orthostasis.  Laceration repair described above.  CT imaging of the head will be obtained.  CT imaging reviewed by radiology myself.  Unremarkable.  Patient safe for discharge home.  Return precautions discussed. Final Clinical Impression(s) / ED Diagnoses Final diagnoses:  None    Rx / DC Orders ED Discharge Orders    None       Breck Coons, MD 02/15/21 [phone]

## 2021-02-15 NOTE — Discharge Instructions (Signed)
Staples need to be removed in 10 to 14 days.  You can come here you can go to urgent care you can go to your primary doctor.  I recommend often setting an appointment for that time so you don't have to wait.  You can let warm soapy water run over it and blot it dry.  Look out for signs of infection to include significant redness or pus draining from the wound.  For pain control you can use Tylenol Motrin as described below.  You can take 600 mg of ibuprofen every 6 hours, you can take 1000 mg of Tylenol every 6 hours, you can alternate these every 3 or you can take them together.

## 2021-02-15 NOTE — ED Notes (Addendum)
ED Provider at bedside- Dr. Myrtis Ser discussing d/c instructions with patient and family

## 2021-02-15 NOTE — ED Triage Notes (Signed)
Home alone, [place]. 1:15pm she was Potting flowers on her deck.  Sudden onset dizziness, syncopal episode, woke up lying on deck with bleeding laceration on back of her head. Husband arrived home and found her awake but "foggy".  No vomiting

## 2021-08-20 ENCOUNTER — Encounter (HOSPITAL_COMMUNITY): Payer: Self-pay | Admitting: *Deleted

## 2021-10-03 IMAGING — DX DG CHEST 1V PORT
1 series · 1 of 1 positions shown · non-contrast
Comparison: 11/27/2018

CLINICAL DATA: Chest pain short of breath

EXAM:
PORTABLE CHEST 1 VIEW

[chest ap]
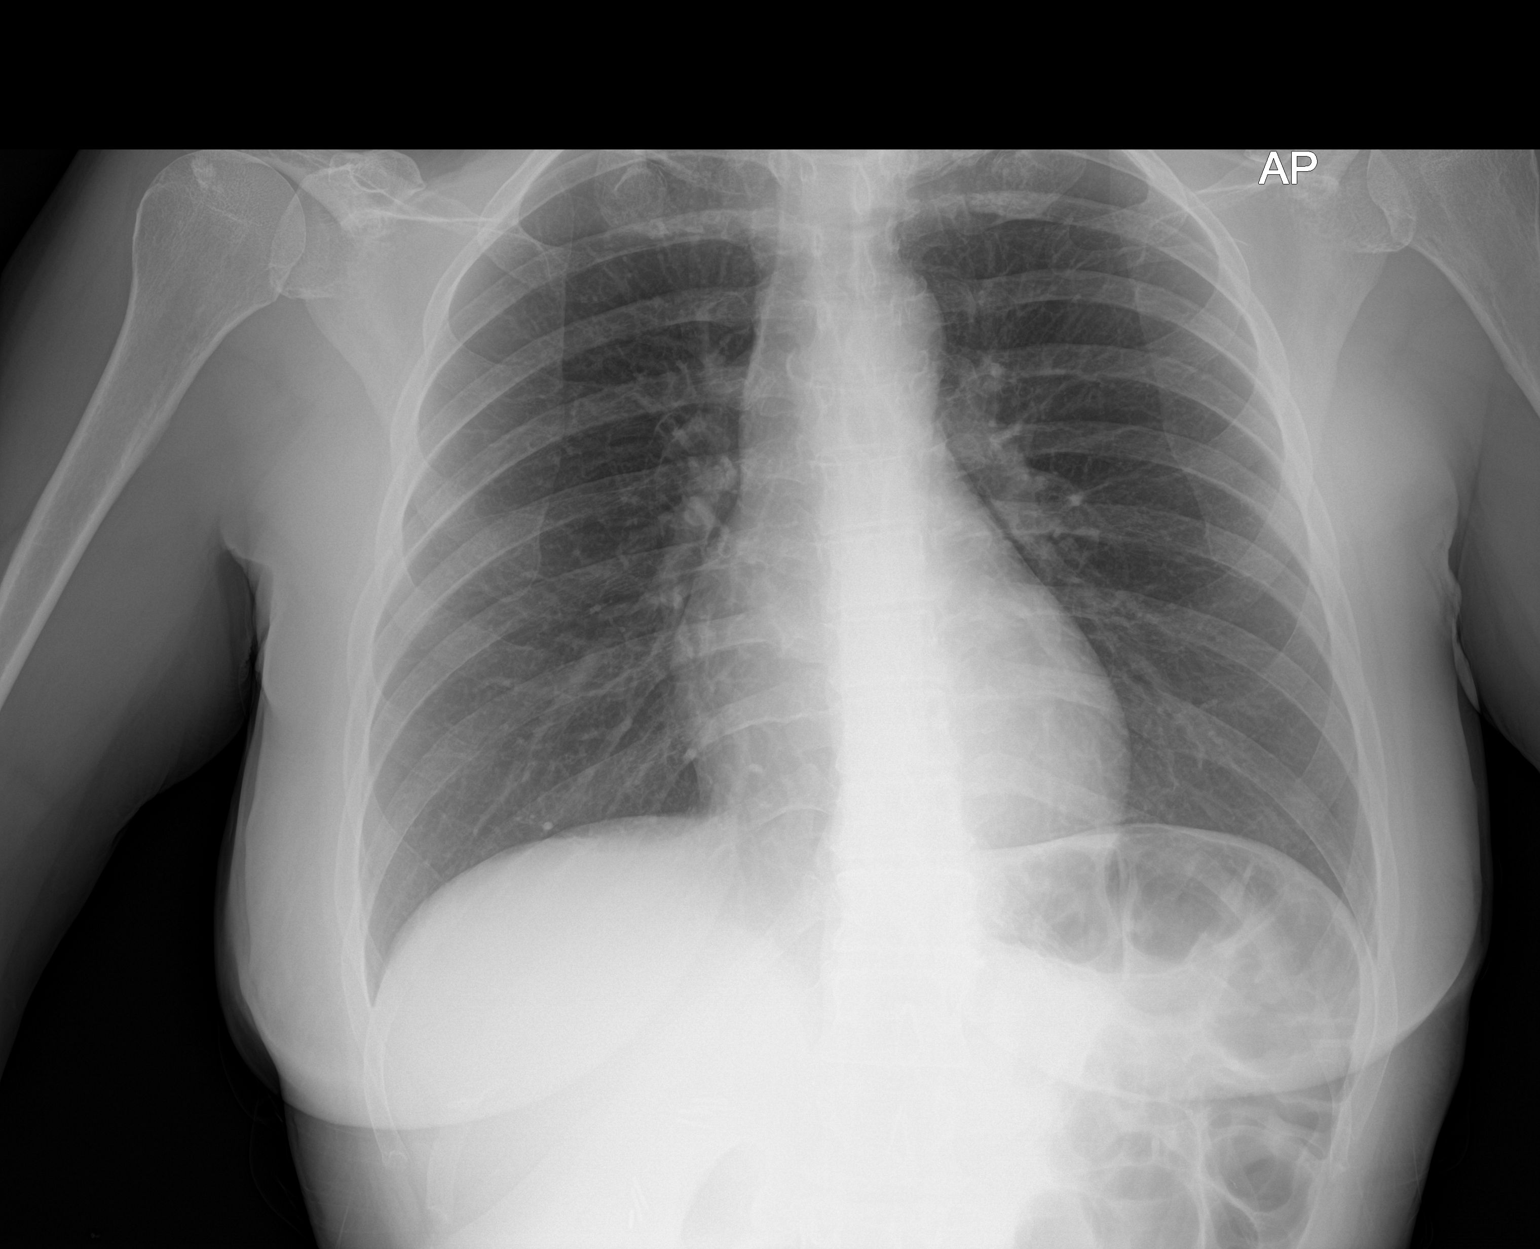

[1 of 1 positions shown; findings below may reference images not displayed]

FINDINGS: The heart size and mediastinal contours are within normal limits.
Both lungs are clear. The visualized skeletal structures are
unremarkable.
IMPRESSION: No active disease.

## 2022-05-27 IMAGING — CT CT HEAD W/O CM
4 series · 16 of 47 positions shown, 18 images · non-contrast
Comparison: 12/15/2014 head CT

CLINICAL DATA: 45-year-old female with fall, head injury and
dizziness. Initial encounter.

EXAM:
CT HEAD WITHOUT CONTRAST
TECHNIQUE: Contiguous axial images were obtained from the base of the skull
through the vertex without intravenous contrast.

[Series 2: head wo · axial · 0.45mm/px · z∈[-146,-36]mm · 7 of 30 slices shown, 9 images]
[im 4/30  brain]
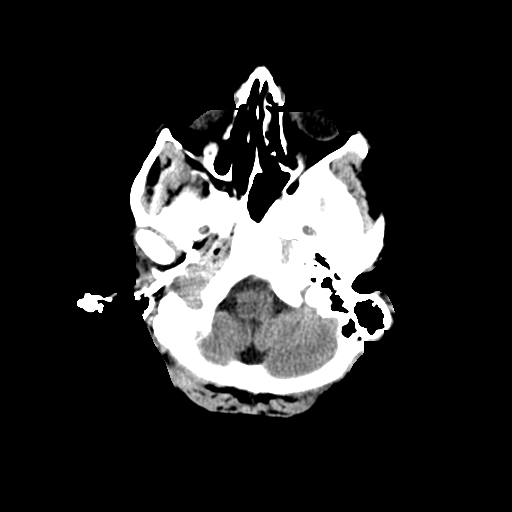
[im 4/30  bone]
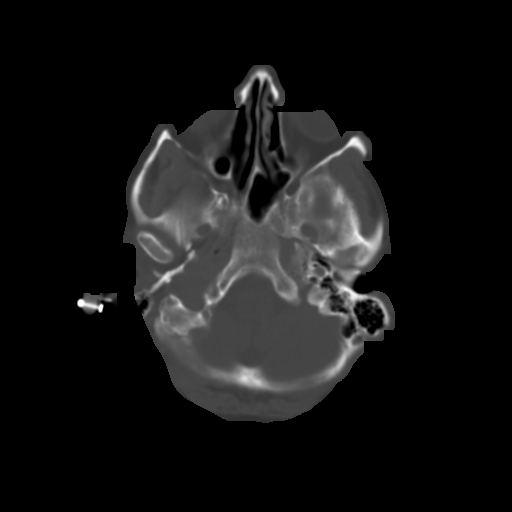
[im 8/30  brain]
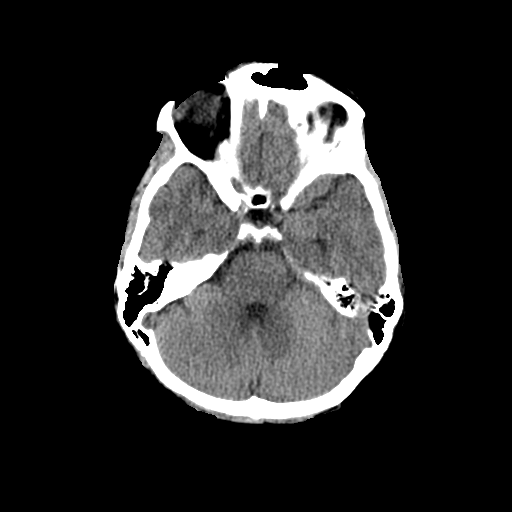
[im 11/30  brain]
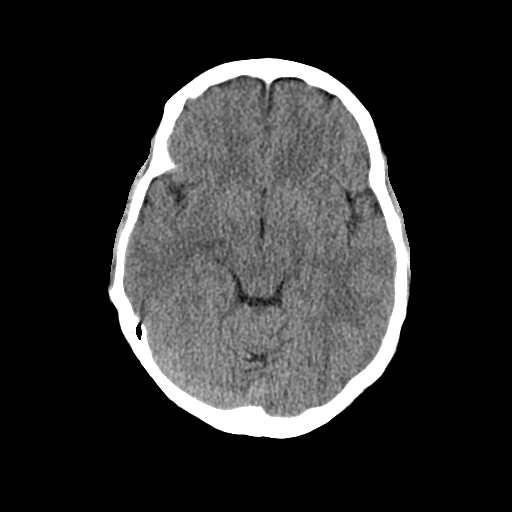
[im 15/30  brain]
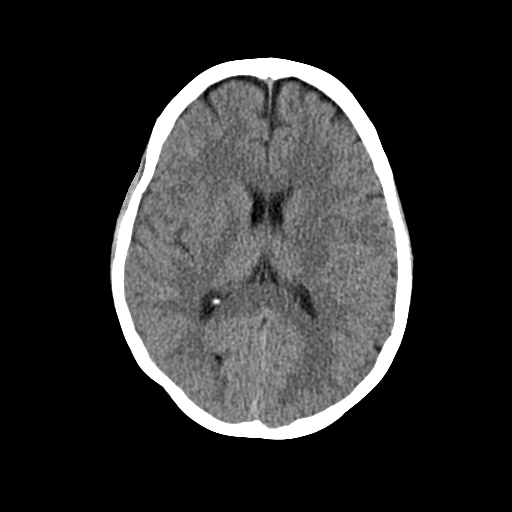
[im 19/30  brain]
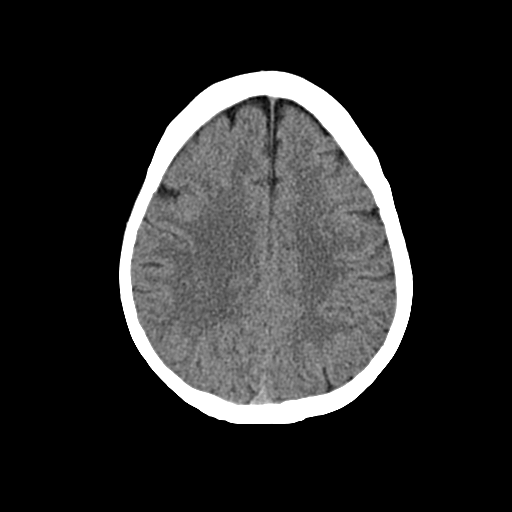
[im 19/30  bone]
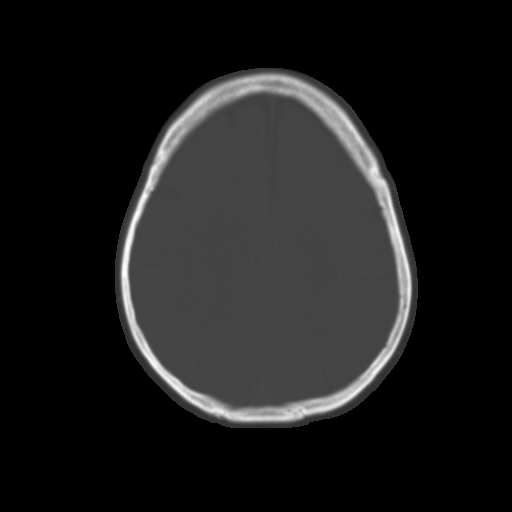
[im 22/30  brain]
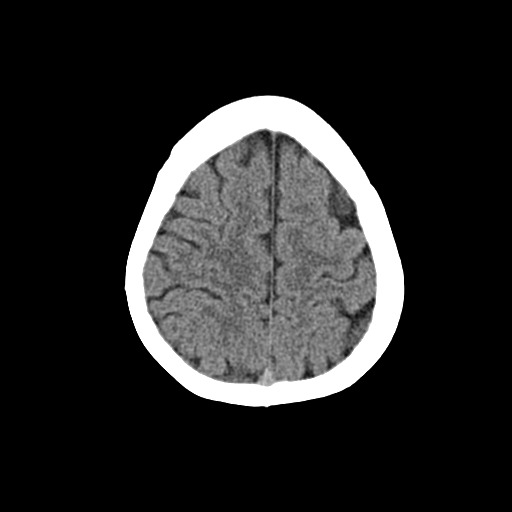
[im 26/30  brain]
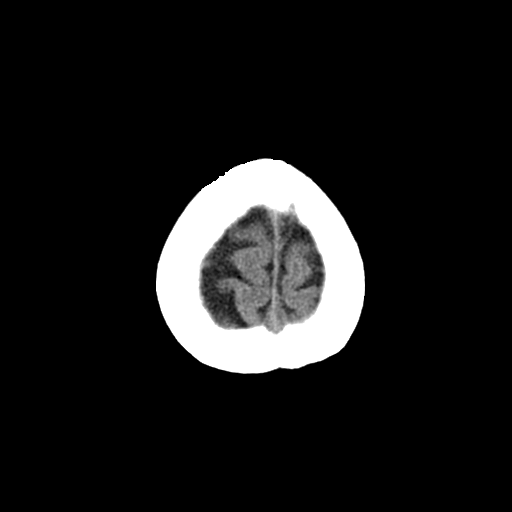

[Series 3: head bone · axial · 0.45mm/px · z∈[-147,-117]mm · 3 of 75 slices shown]
[im 8/75  bone]
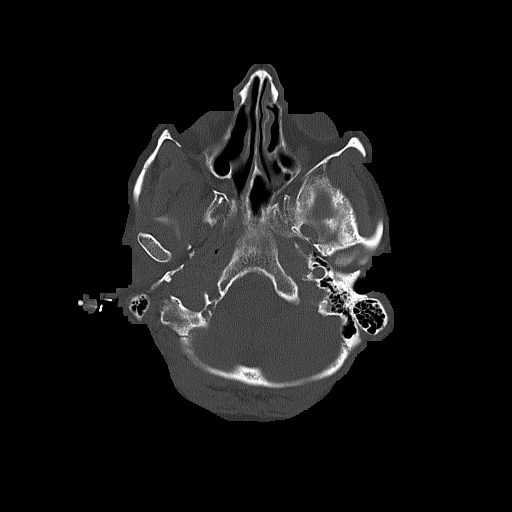
[im 15/75  bone]
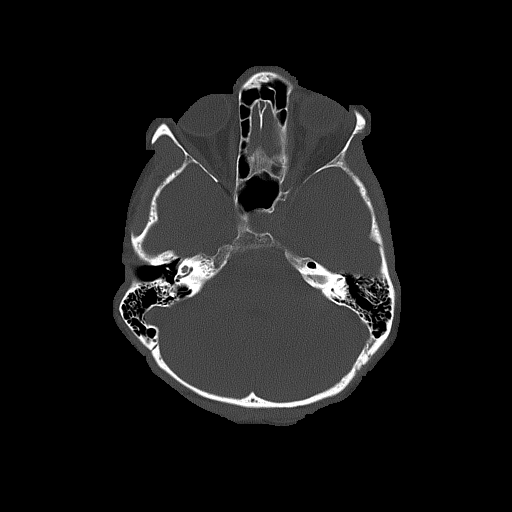
[im 23/75  bone]
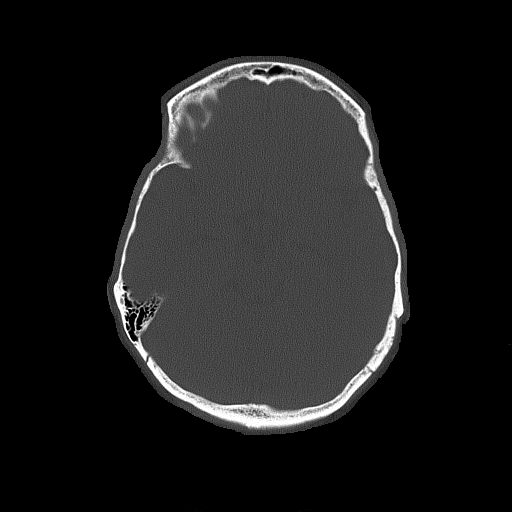

[Series 4: cor soft · coronal · 0.30mm/px · 3 of 64 slices shown]
[im 22/64  brain]
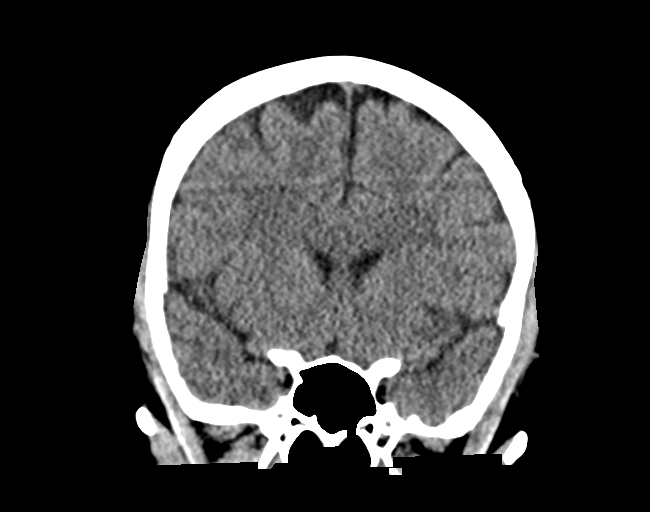
[im 29/64  brain]
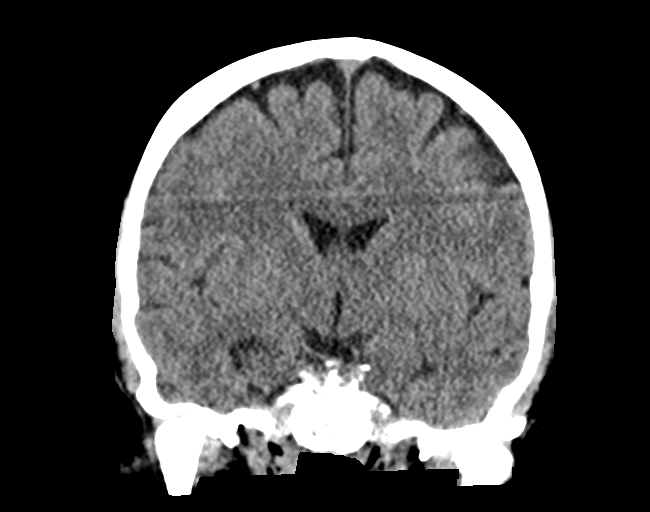
[im 36/64  brain]
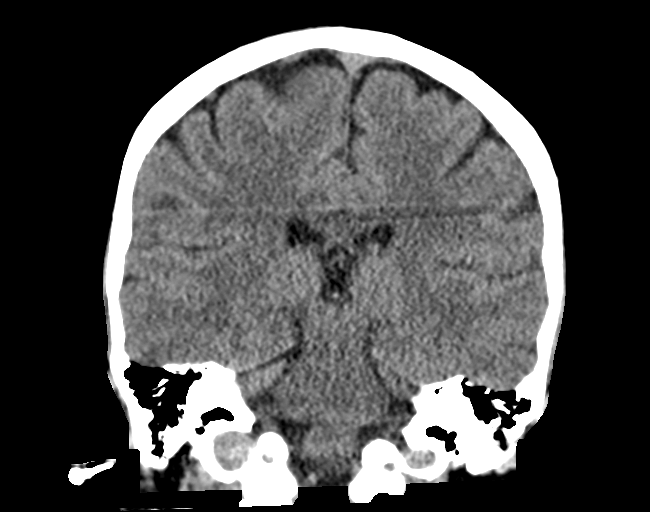

[Series 5: sag soft · sagittal · 0.30mm/px · 3 of 53 slices shown]
[im 18/53  brain]
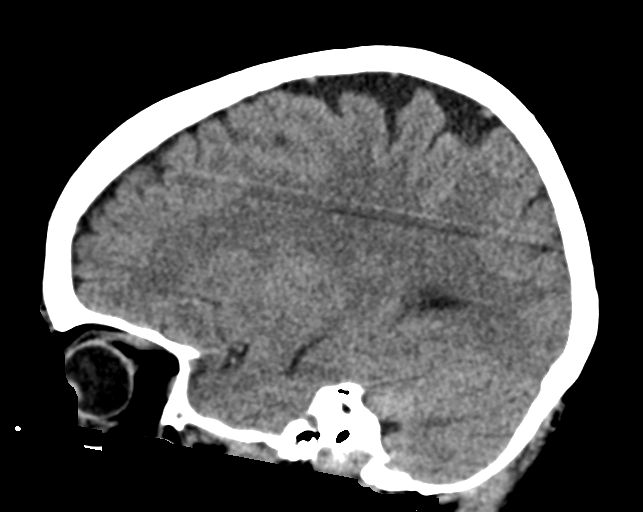
[im 27/53  brain]
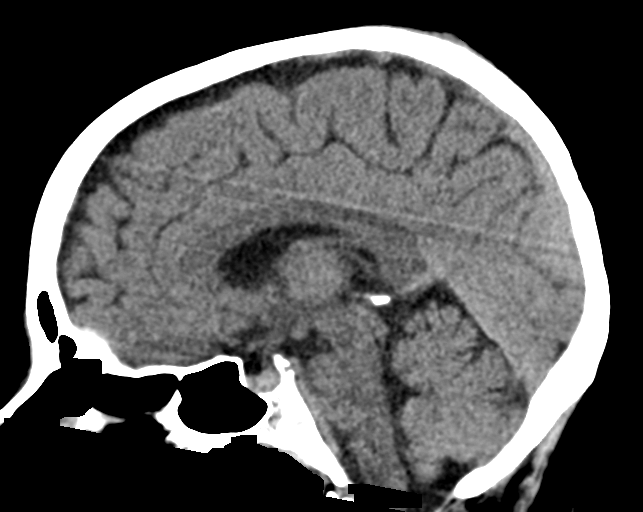
[im 35/53  brain]
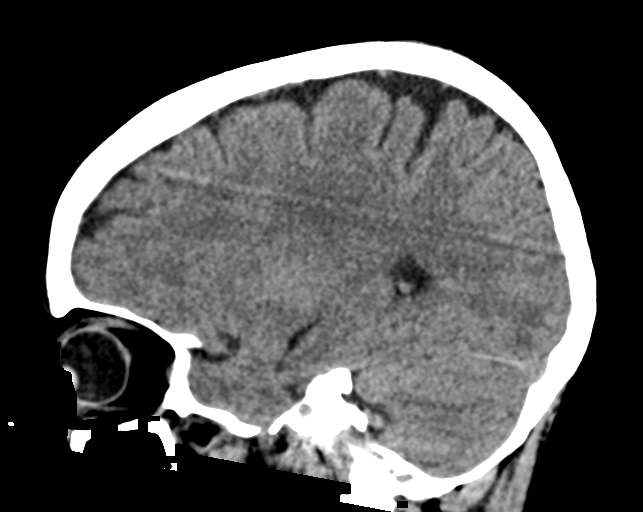

[16 of 47 positions shown; findings below may reference images not displayed]

FINDINGS: Brain: No evidence of acute infarction, hemorrhage, hydrocephalus,
extra-axial collection or mass lesion/mass effect.

Vascular: No hyperdense vessel or unexpected calcification.

Skull: Normal. Negative for fracture or focal lesion.

Sinuses/Orbits: No acute finding.

Other: Surgical staples at the apex of the scalp noted.
IMPRESSION: No evidence of intracranial abnormality.

## 2023-08-11 ENCOUNTER — Encounter (HOSPITAL_COMMUNITY): Payer: Self-pay | Admitting: *Deleted

## 2024-08-24 ENCOUNTER — Encounter (HOSPITAL_COMMUNITY): Payer: Self-pay | Admitting: *Deleted

## 2024-10-22 NOTE — Telephone Encounter (Signed)
 Telephone Triage Nurse Note Notification of Lab Results  S: Situation Full name and date of birth verified.  Patient: Jamie Ellis DOB: 04/12/1976  Date: Mon 10/22/2024  Time: 3:58 PM   B: Background Vitamin B 12 and CBC   A: Assessment -Pt notified by physician using [facility] Chart   R: Recommendation -Advise the patient to contact the office if patient has additional questions or concerns.  Nurse Victoria K Ziglar, LPN

## 2024-11-26 ENCOUNTER — Inpatient Hospital Stay

## 2024-11-26 ENCOUNTER — Inpatient Hospital Stay: Admitting: Family
